# Patient Record
Sex: Female | Born: 1960 | Race: White | Hispanic: No | Marital: Married | Smoking: Never smoker
Health system: Southern US, Community
[De-identification: ages and names within clinical notes are randomized; demographics above are authoritative.]

## PROBLEM LIST (undated history)

## (undated) DIAGNOSIS — N393 Stress incontinence (female) (male): Secondary | ICD-10-CM

## (undated) DIAGNOSIS — R0789 Other chest pain: Secondary | ICD-10-CM

## (undated) DIAGNOSIS — Z780 Asymptomatic menopausal state: Secondary | ICD-10-CM

## (undated) HISTORY — DX: Other chest pain: R07.89

## (undated) HISTORY — DX: Stress incontinence (female) (male): N39.3

## (undated) HISTORY — DX: Asymptomatic menopausal state: Z78.0

---

## 2007-07-10 ENCOUNTER — Encounter: Admission: RE | Admit: 2007-07-10 | Discharge: 2007-07-10 | Payer: Self-pay | Admitting: Obstetrics and Gynecology

## 2007-07-11 ENCOUNTER — Encounter: Admission: RE | Admit: 2007-07-11 | Discharge: 2007-07-11 | Payer: Self-pay | Admitting: Interventional Radiology

## 2007-08-12 ENCOUNTER — Ambulatory Visit (HOSPITAL_COMMUNITY): Admission: RE | Admit: 2007-08-12 | Discharge: 2007-08-12 | Payer: Self-pay | Admitting: Obstetrics and Gynecology

## 2007-08-12 ENCOUNTER — Encounter (INDEPENDENT_AMBULATORY_CARE_PROVIDER_SITE_OTHER): Payer: Self-pay | Admitting: Obstetrics and Gynecology

## 2007-11-27 ENCOUNTER — Ambulatory Visit (HOSPITAL_COMMUNITY): Admission: RE | Admit: 2007-11-27 | Discharge: 2007-11-28 | Payer: Self-pay | Admitting: Interventional Radiology

## 2007-12-17 ENCOUNTER — Encounter: Admission: RE | Admit: 2007-12-17 | Discharge: 2007-12-17 | Payer: Self-pay | Admitting: Interventional Radiology

## 2008-06-03 ENCOUNTER — Encounter: Admission: RE | Admit: 2008-06-03 | Discharge: 2008-06-03 | Payer: Self-pay | Admitting: Interventional Radiology

## 2010-01-23 ENCOUNTER — Encounter: Payer: Self-pay | Admitting: Interventional Radiology

## 2010-01-24 ENCOUNTER — Encounter: Payer: Self-pay | Admitting: Obstetrics and Gynecology

## 2010-05-17 NOTE — Op Note (Signed)
NAME:  Cindy Rivas, Cindy Rivas      ACCOUNT NO.:  [id number]   MEDICAL RECORD NO.:  [id number]          PATIENT TYPE:  AMB   LOCATION:  SDC                           FACILITY:  [facility]   PHYSICIAN:  Juluis Mire, M.D.   DATE OF BIRTH:  [birth year]   DATE OF PROCEDURE:  DATE OF DISCHARGE:                               OPERATIVE REPORT   PREOPERATIVE DIAGNOSES:  1. Endometrial polyp.  2. Uterine fibroids.   POSTOPERATIVE DIAGNOSES:  1. Endometrial polyp.  2. Uterine fibroids.   PROCEDURES:  1. Paracervical block.  2. Cervical dilatation.  3. Hysteroscopic evaluation.  4. Resection of irregular-minced uterine lining along with endometrial      curettings.   SURGEON:  Juluis Mire, MD   ANESTHESIA:  Paracervical block as well as general.   ESTIMATED BLOOD LOSS:  Minimal.   PACKS AND DRAINS:  None.   INTRAOPERATIVE BLOOD PLACED:  None.   COMPLICATIONS:  None.   INDICATIONS:  Dictated in the history and physical.   PROCEDURE:  The patient was taken to OR.  After satisfactory level of  general anesthesia was obtained, the patient was placed in a dorsal  lithotomy position using the Allen stirrups.  The perineum and vagina  were prepped out with Betadine, and the patient was then draped in a  sterile field.  A speculum was placed in the vaginal vault.  Cervix was  grasped with single-tooth tenaculum.  Paracervical blocks administered  using 1% Nesacaine.  Cervix dilated by size 29 Pratt dilator.  Operative  hysteroscope was then introduced.  Intrauterine cavity was distended  using sorbitol.  Visualization revealed no distinct polyp.  She had had  some irregularities on the right lower uterine wall, this was resected.  We did multiple endometrial biopsies and curettings.  We could see both  tubal ostiums, there were no signs of any perforation.  Total deficit  was between 200 and 300 mL.  At this point in time, a single-  tooth tenaculum and speculum removed.  The patient  taken out of the  dorsal lithotomy position, once alert, and extubated, transferred to  recovery room in good condition.  Sponge, instrument, and needle count  was correct by circulating nurse.      Juluis Mire, M.D.  Electronically Signed     JSM/MEDQ  D:  08/12/2007  T:  08/13/2007  Job:  [id number]

## 2010-05-17 NOTE — H&P (Signed)
NAME:  Cindy Rivas, Cindy Rivas NO.:  [id number]   MEDICAL RECORD NO.:  [id number]          PATIENT TYPE:  AMB   LOCATION:  SDC                           FACILITY:  [facility]   PHYSICIAN:  Juluis Mire, M.D.   DATE OF BIRTH:  08-06-1960   DATE OF ADMISSION:  08/12/2007  DATE OF DISCHARGE:                              HISTORY & PHYSICAL   The patient is a 50 year old gravida 2, para 2 female who presents for  annual exam.  She is scheduled today for hysteroscopic resection of  small polyp.  She had been scheduled for an embolization of the fibroid.  During saline infusion ultrasound, besides the large fibroid on the left  side, there was a polypoid mass within endometrial cavity.  Before  embolization, I suggested to resect the polyp to determine a pathology.  Therefore she comes in today for that.   ALLERGIES:  PENICILLIN.   MEDICATIONS:  None.   PAST MEDICAL HISTORY:  Usual childhood diseases.  No significant  sequelae.   SURGERIES:  She has had tonsillectomy.   OBSTETRICAL:  She has had 2 vaginal deliveries.   FAMILY HISTORY:  Noncontributory.   SOCIAL HISTORY:  No tobacco or alcohol use.   REVIEW OF SYSTEMS:  Noncontributory.   PHYSICAL EXAM:  GENERAL:  The patient is afebrile.  VITAL SIGNS:  Stable.  HEENT: The patient is normocephalic.  Pupils equal, round, and reactive  to light and accommodation.  Extraocular movements are intact.  Sclerae  and conjunctivae are clear.  Oropharynx clear.  NECK:  Without thyromegaly.  BREASTS:  Not examined.  LUNGS:  Clear.  CARDIAC:  Regular rate without murmurs or gallops.  ABDOMEN:  Benign.  No mass, organomegaly, or tenderness.  PELVIC:  Uterus is enlarged 12-14 weeks' size with a known fibroid.  Adnexa unremarkable.  EXTREMITIES:  Trace edema.  NEUROLOGIC:  Grossly within normal limits.   IMPRESSION:  Uterine fibroid with associated symptomatology.   PLAN:  1. Embolization.  2. Endometrial polyp.   PLAN:  The  patient to undergo a hysteroscopic evaluation for resection  of polyp.  Risks of surgery have been discussed including the risk of  infection.  The risk of hemorrhage could require transfusion with risk  of AIDS or hepatitis.  Excessive bleeding could lead to hysterectomy.  There is a risk of injury to adjacent organs that could require further  exploratory surgery.  Risk of deep venous thrombosis and pulmonary  embolus.  The patient does understand indications and risks.      Juluis Mire, M.D.  Electronically Signed     JSM/MEDQ  D:  08/12/2007  T:  08/13/2007  Job:  [id number]

## 2010-09-30 LAB — CBC
MCV: 88.1
Platelets: 184
RBC: 4.73
RDW: 14.5

## 2010-09-30 LAB — HCG, SERUM, QUALITATIVE: Preg, Serum: NEGATIVE

## 2010-10-04 LAB — CBC
Hemoglobin: 11.3 — ABNORMAL LOW
MCHC: 32.5
Platelets: 169
RDW: 12.8

## 2010-10-04 LAB — CREATININE, SERUM
Creatinine, Ser: 0.82
GFR calc Af Amer: >60

## 2010-10-04 LAB — HCG, SERUM, QUALITATIVE: Preg, Serum: NEGATIVE

## 2014-01-05 ENCOUNTER — Other Ambulatory Visit: Payer: Self-pay | Admitting: Obstetrics and Gynecology

## 2014-01-06 LAB — CYTOLOGY - PAP

## 2015-07-13 DIAGNOSIS — H9313 Tinnitus, bilateral: Secondary | ICD-10-CM | POA: Insufficient documentation

## 2015-07-13 DIAGNOSIS — H9042 Sensorineural hearing loss, unilateral, left ear, with unrestricted hearing on the contralateral side: Secondary | ICD-10-CM | POA: Insufficient documentation

## 2015-08-04 ENCOUNTER — Other Ambulatory Visit: Payer: Self-pay | Admitting: Gastroenterology

## 2015-08-04 DIAGNOSIS — R7989 Other specified abnormal findings of blood chemistry: Secondary | ICD-10-CM

## 2015-08-04 DIAGNOSIS — R945 Abnormal results of liver function studies: Principal | ICD-10-CM

## 2015-08-16 ENCOUNTER — Encounter (INDEPENDENT_AMBULATORY_CARE_PROVIDER_SITE_OTHER): Payer: Self-pay

## 2015-08-16 ENCOUNTER — Ambulatory Visit
Admission: RE | Admit: 2015-08-16 | Discharge: 2015-08-16 | Disposition: A | Discharge status: Home / Self Care | Payer: BC Managed Care – PPO | Source: Ambulatory Visit | Attending: Gastroenterology | Admitting: Gastroenterology

## 2015-08-16 DIAGNOSIS — R945 Abnormal results of liver function studies: Principal | ICD-10-CM

## 2015-08-16 DIAGNOSIS — R7989 Other specified abnormal findings of blood chemistry: Secondary | ICD-10-CM

## 2016-11-20 IMAGING — US US ABDOMEN COMPLETE
1 series · 14 of 25 positions shown · non-contrast
Comparison: None.

CLINICAL DATA: Elevated liver function tests

EXAM:
ABDOMEN ULTRASOUND COMPLETE

[Series 1: us abdomen complete · 0.20mm/px · 14 of 73 slices shown]
[im 1/73]
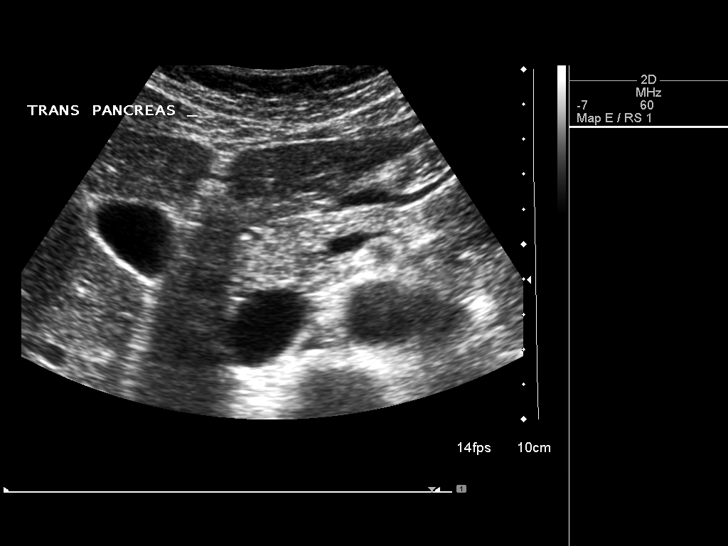
[im 7/73]
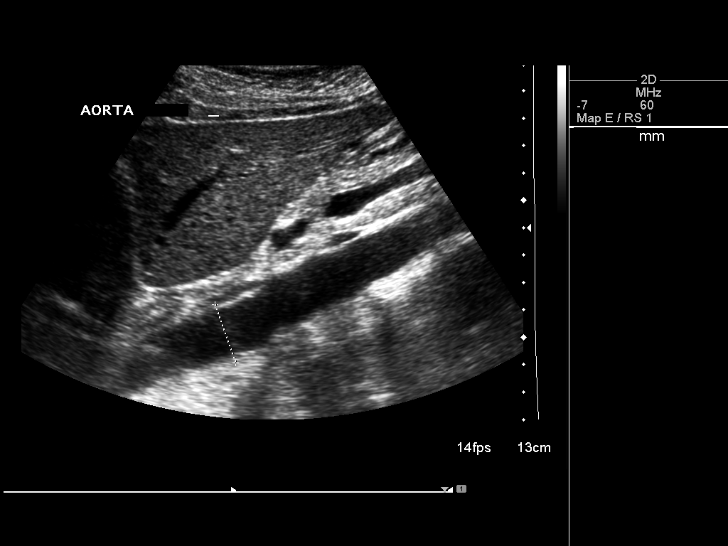
[im 13/73]
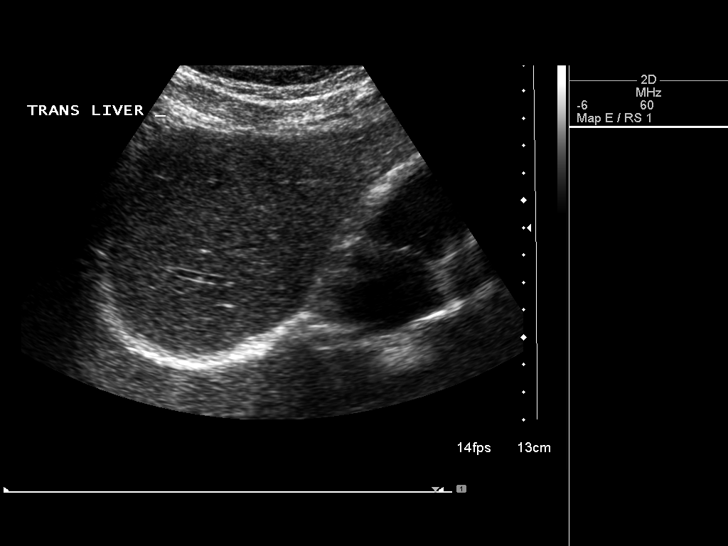
[im 19/73]
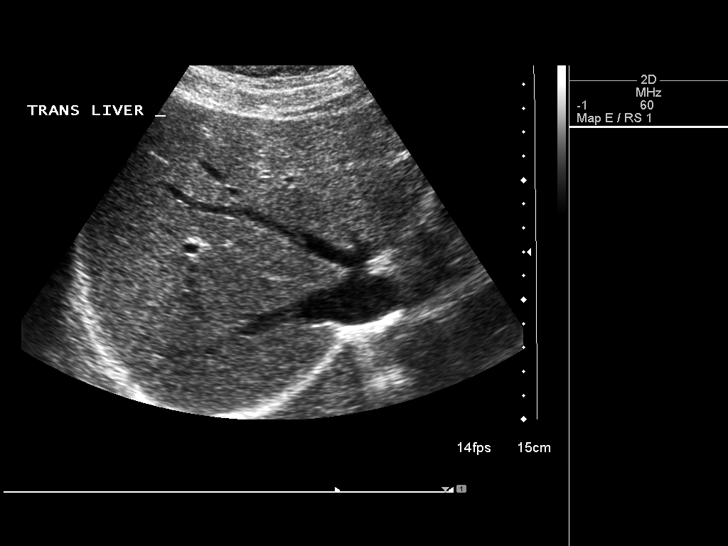
[im 25/73]
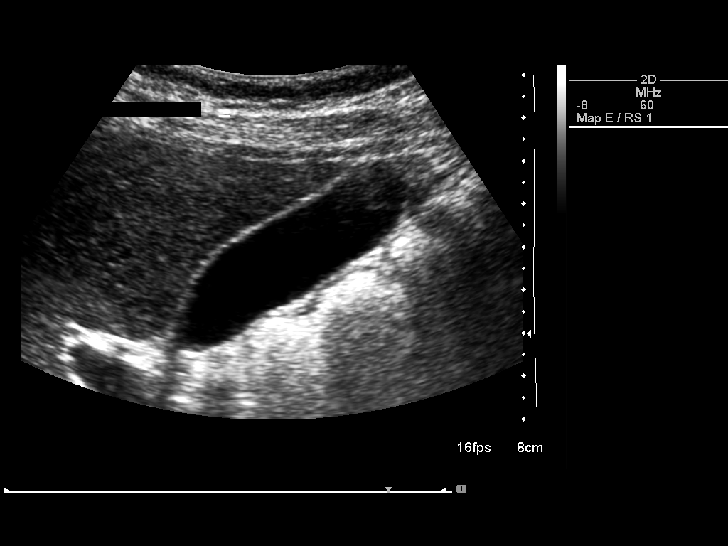
[im 28/73]
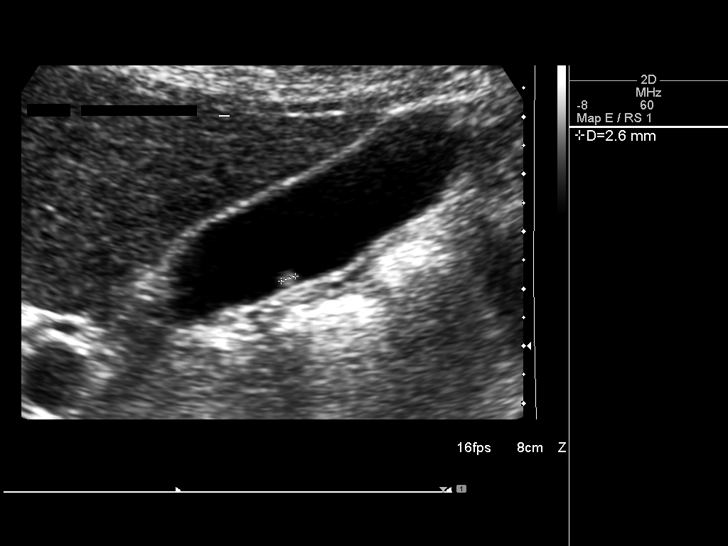
[im 34/73]
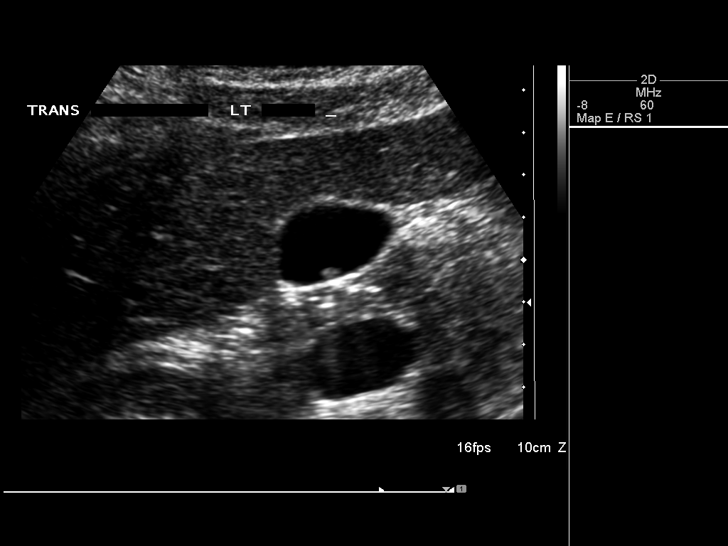
[im 40/73]
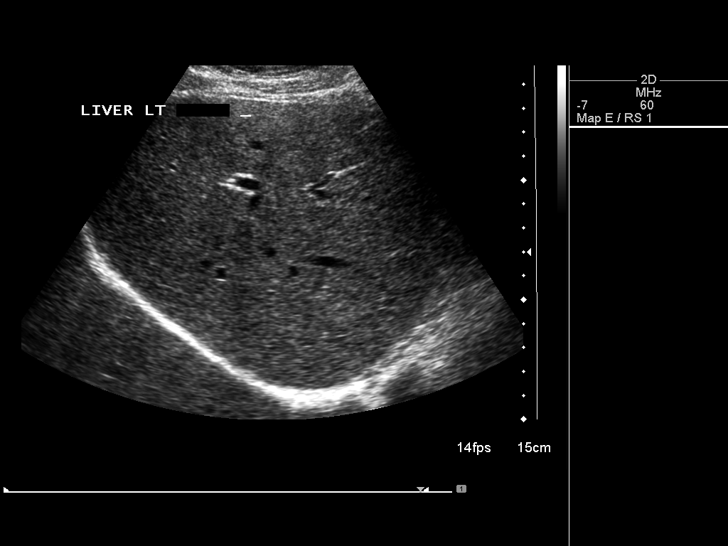
[im 46/73]
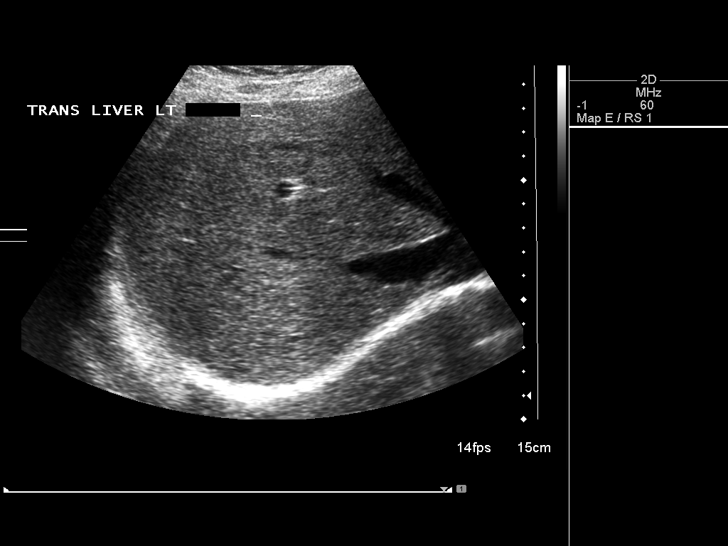
[im 49/73]
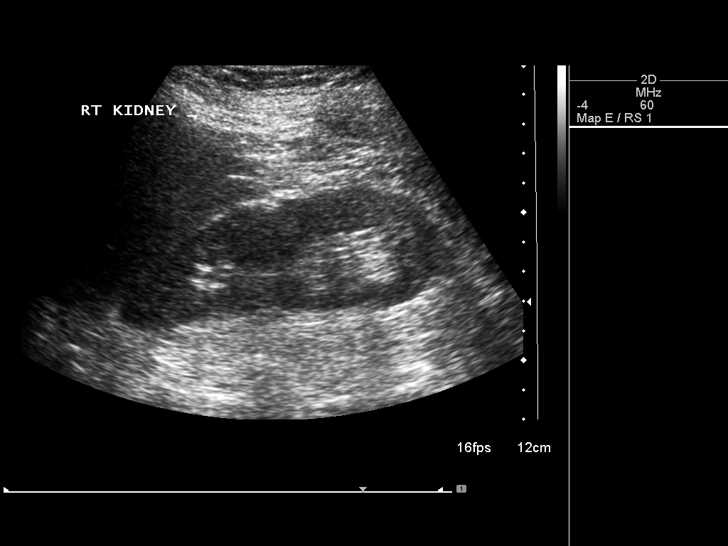
[im 55/73]
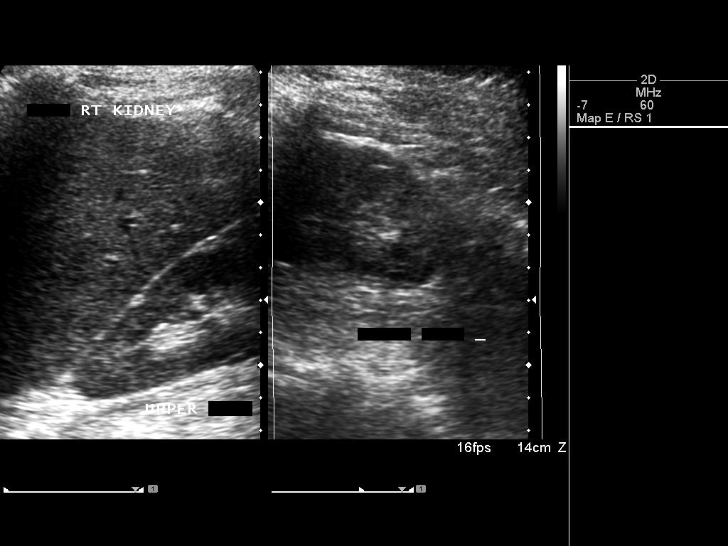
[im 61/73]
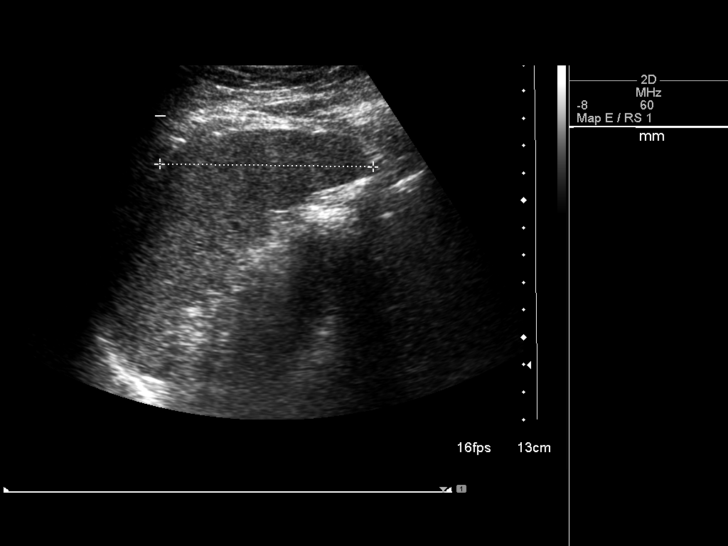
[im 67/73]
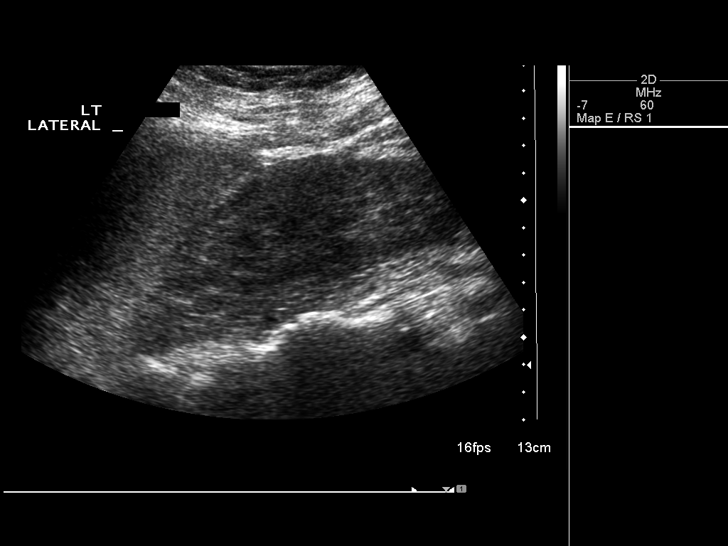
[im 73/73]
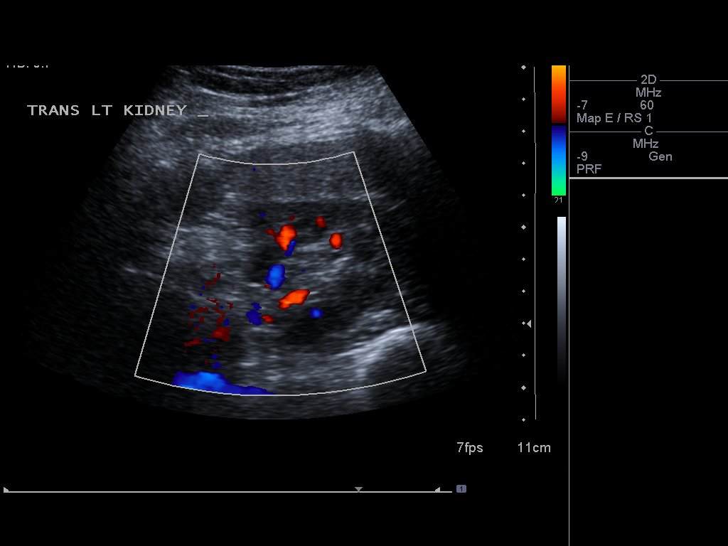

[14 of 25 positions shown; findings below may reference images not displayed]

FINDINGS: Gallbladder: Nonmobile non shadowing 3 mm structure along the
posterior wall of the gallbladder consistent with polyp. No wall
thickening or focal tenderness.

Common bile duct: Diameter: 2 mm

Liver: No focal lesion identified. Within normal limits in
parenchymal echogenicity.

IVC: No abnormality visualized.

Pancreas: Visualized portion unremarkable.

Spleen: Size and appearance within normal limits.

Right Kidney: Length: 11.5 cm. Echogenicity within normal limits. No
mass or hydronephrosis visualized.

Left Kidney: Length: 12.5 cm. Echogenicity within normal limits. No
mass or hydronephrosis visualized.

Abdominal aorta: No aneurysm visualized.

Other findings: None.
IMPRESSION: 1. 3 mm gallbladder polyp.
2.  Otherwise normal exam.  No evidence of hepatic steatosis.

## 2017-11-12 ENCOUNTER — Ambulatory Visit (HOSPITAL_COMMUNITY)
Admission: RE | Admit: 2017-11-12 | Discharge: 2017-11-12 | Disposition: A | Discharge status: Home / Self Care | Payer: BC Managed Care – PPO | Source: Ambulatory Visit | Attending: Obstetrics and Gynecology | Admitting: Obstetrics and Gynecology

## 2017-11-12 ENCOUNTER — Other Ambulatory Visit (HOSPITAL_COMMUNITY): Payer: Self-pay | Admitting: Orthopedic Surgery

## 2017-11-12 DIAGNOSIS — M7989 Other specified soft tissue disorders: Secondary | ICD-10-CM | POA: Insufficient documentation

## 2017-11-12 DIAGNOSIS — M79605 Pain in left leg: Secondary | ICD-10-CM

## 2017-11-12 NOTE — Progress Notes (Signed)
LLE venous duplex prelim: negative for DVT and baker's cyst.  Farrel Demark, RDMS, RVT   Called report to [facility]

## 2019-03-24 ENCOUNTER — Other Ambulatory Visit: Payer: Self-pay

## 2019-03-24 ENCOUNTER — Ambulatory Visit (INDEPENDENT_AMBULATORY_CARE_PROVIDER_SITE_OTHER): Payer: BC Managed Care – PPO

## 2019-03-24 ENCOUNTER — Encounter: Payer: Self-pay | Admitting: Podiatry

## 2019-03-24 ENCOUNTER — Ambulatory Visit: Payer: BC Managed Care – PPO | Admitting: Podiatry

## 2019-03-24 VITALS — Temp 98.0°F

## 2019-03-24 DIAGNOSIS — M2012 Hallux valgus (acquired), left foot: Secondary | ICD-10-CM | POA: Diagnosis not present

## 2019-03-24 DIAGNOSIS — M2011 Hallux valgus (acquired), right foot: Secondary | ICD-10-CM

## 2019-03-24 DIAGNOSIS — M2041 Other hammer toe(s) (acquired), right foot: Secondary | ICD-10-CM | POA: Diagnosis not present

## 2019-03-24 NOTE — Patient Instructions (Signed)
Bunion  A bunion is a bump on the base of the big toe that forms when the bones of the big toe joint move out of position. Bunions may be small at first, but they often get larger over time. They can make walking painful. What are the causes? A bunion may be caused by:  Wearing narrow or pointed shoes that force the big toe to press against the other toes.  Abnormal foot development that causes the foot to roll inward (pronate).  Changes in the foot that are caused by certain diseases, such as rheumatoid arthritis or polio.  A foot injury. What increases the risk? The following factors may make you more likely to develop this condition:  Wearing shoes that squeeze the toes together.  Having certain diseases, such as: ? Rheumatoid arthritis. ? Polio. ? Cerebral palsy.  Having family members who have bunions.  Being born with a foot deformity, such as flat feet or low arches.  Doing activities that put a lot of pressure on the feet, such as ballet dancing. What are the signs or symptoms? The main symptom of a bunion is a noticeable bump on the big toe. Other symptoms may include:  Pain.  Swelling around the big toe.  Redness and inflammation.  Thick or hardened skin on the big toe or between the toes.  Stiffness or loss of motion in the big toe.  Trouble with walking. How is this diagnosed? A bunion may be diagnosed based on your symptoms, medical history, and activities. You may have tests, such as:  X-rays. These allow your health care provider to check the position of the bones in your foot and look for damage to your joint. They also help your health care provider determine the severity of your bunion and the best way to treat it.  Joint aspiration. In this test, a sample of fluid is removed from the toe joint. This test may be done if you are in a lot of pain. It helps rule out diseases that cause painful swelling of the joints, such as arthritis. How is this  treated? Treatment depends on the severity of your symptoms. The goal of treatment is to relieve symptoms and prevent the bunion from getting worse. Your health care provider may recommend:  Wearing shoes that have a wide toe box.  Using bunion pads to cushion the affected area.  Taping your toes together to keep them in a normal position.  Placing a device inside your shoe (orthotics) to help reduce pressure on your toe joint.  Taking medicine to ease pain, inflammation, and swelling.  Applying heat or ice to the affected area.  Doing stretching exercises.  Surgery to remove scar tissue and move the toes back into their normal position. This treatment is rare. Follow these instructions at home: Managing pain, stiffness, and swelling   If directed, put ice on the painful area: ? Put ice in a plastic bag. ? Place a towel between your skin and the bag. ? Leave the ice on for 20 minutes, 2-3 times a day. Activity   If directed, apply heat to the affected area before you exercise. Use the heat source that your health care provider recommends, such as a moist heat pack or a heating pad. ? Place a towel between your skin and the heat source. ? Leave the heat on for 20-30 minutes. ? Remove the heat if your skin turns bright red. This is especially important if you are unable to feel pain,   heat, or cold. You may have a greater risk of getting burned.  Do exercises as told by your health care provider. General instructions  Support your toe joint with proper footwear, shoe padding, or taping as told by your health care provider.  Take over-the-counter and prescription medicines only as told by your health care provider.  Keep all follow-up visits as told by your health care provider. This is important. Contact a health care provider if your symptoms:  Get worse.  Do not improve in 2 weeks. Get help right away if you have:  Severe pain and trouble with walking. Summary  A  bunion is a bump on the base of the big toe that forms when the bones of the big toe joint move out of position.  Bunions can make walking painful.  Treatment depends on the severity of your symptoms.  Support your toe joint with proper footwear, shoe padding, or taping as told by your health care provider. This information is not intended to replace advice given to you by your health care provider. Make sure you discuss any questions you have with your health care provider. Document Revised: 06/25/2017 Document Reviewed: 05/01/2017 Elsevier Patient Education  2020 [employer].  

## 2019-03-25 NOTE — Progress Notes (Signed)
Subjective:   Patient ID: Cindy Rivas, female   DOB: 59 y.o.   MRN: [id number]   HPI Patient presents stating my bunion right is really bothering me and it is getting worse and it throbs at night and it sharp pains when I am walking.  Also my second and fifth toes bother me and make it hard for me to be active and states this is been an ongoing issue   Review of Systems  All other systems reviewed and are negative.       Objective:  Physical Exam Vitals and nursing note reviewed.  Constitutional:      Appearance: She is well-developed.  Pulmonary:     Effort: Pulmonary effort is normal.  Musculoskeletal:        General: Normal range of motion.  Skin:    General: Skin is warm.  Neurological:     Mental Status: She is alert.     Neurovascular status intact muscle strength found to be adequate range of motion within normal limits with large bunion deformity right over left with redness and pain around the side elevated second digit right over left with elongation component and rotated fifth digit right over left that is painful when pressed with keratotic tissue formation.  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  I explained procedures to patient patient wants surgery and will be seen back to recheck in all information given to patient at the current time and patient is willing to accept this risk wants procedure and at this point is scheduled for procedure and will reappoint for consult and was encouraged to bring any questions she has at the time of  procedure.  Understands distal osteotomy would not get her full correction but it should give her good adequate correction and will not require nonweightbearing  X-rays indicate significant structural bunion deformity right over left with digital deformity and elongation digit to bilateral and rotation digit 5

## 2019-05-19 ENCOUNTER — Encounter: Payer: Self-pay | Admitting: Podiatry

## 2019-05-19 ENCOUNTER — Other Ambulatory Visit: Payer: Self-pay

## 2019-05-19 ENCOUNTER — Ambulatory Visit: Payer: BC Managed Care – PPO | Admitting: Podiatry

## 2019-05-19 DIAGNOSIS — M2012 Hallux valgus (acquired), left foot: Secondary | ICD-10-CM | POA: Diagnosis not present

## 2019-05-19 DIAGNOSIS — M2011 Hallux valgus (acquired), right foot: Secondary | ICD-10-CM | POA: Diagnosis not present

## 2019-05-19 DIAGNOSIS — M21621 Bunionette of right foot: Secondary | ICD-10-CM | POA: Diagnosis not present

## 2019-05-19 DIAGNOSIS — M2041 Other hammer toe(s) (acquired), right foot: Secondary | ICD-10-CM | POA: Diagnosis not present

## 2019-05-19 NOTE — Patient Instructions (Signed)
Pre-Operative Instructions  Congratulations, you have decided to take an important step towards improving your quality of life.  You can be assured that the doctors and staff at [facility] will be with you every step of the way.  Here are some important things you should know:  1. Plan to be at the surgery center/hospital at least 1 (one) hour prior to your scheduled time, unless otherwise directed by the surgical center/hospital staff.  You must have a responsible adult accompany you, remain during the surgery and drive you home.  Make sure you have directions to the surgical center/hospital to ensure you arrive on time. 2. If you are having surgery at [facility] or [facility], you will need a copy of your medical history and physical form from your family physician within one month prior to the date of surgery. We will give you a form for your primary physician to complete.  3. We make every effort to accommodate the date you request for surgery.  However, there are times where surgery dates or times have to be moved.  We will contact you as soon as possible if a change in schedule is required.   4. No aspirin/ibuprofen for one week before surgery.  If you are on aspirin, any non-steroidal anti-inflammatory medications (Mobic, Aleve, Ibuprofen) should not be taken seven (7) days prior to your surgery.  You make take Tylenol for pain prior to surgery.  5. Medications - If you are taking daily heart and blood pressure medications, seizure, reflux, allergy, asthma, anxiety, pain or diabetes medications, make sure you notify the surgery center/hospital before the day of surgery so they can tell you which medications you should take or avoid the day of surgery. 6. No food or drink after midnight the night before surgery unless directed otherwise by surgical center/hospital staff. 7. No alcoholic beverages 24-hours prior to surgery.  No smoking 24-hours prior or 24-hours after  surgery. 8. Wear loose pants or shorts. They should be loose enough to fit over bandages, boots, and casts. 9. Don't wear slip-on shoes. Sneakers are preferred. 10. Bring your boot with you to the surgery center/hospital.  Also bring crutches or a walker if your physician has prescribed it for you.  If you do not have this equipment, it will be provided for you after surgery. 11. If you have not been contacted by the surgery center/hospital by the day before your surgery, call to confirm the date and time of your surgery. 12. Leave-time from work may vary depending on the type of surgery you have.  Appropriate arrangements should be made prior to surgery with your employer. 13. Prescriptions will be provided immediately following surgery by your doctor.  Fill these as soon as possible after surgery and take the medication as directed. Pain medications will not be refilled on weekends and must be approved by the doctor. 14. Remove nail polish on the operative foot and avoid getting pedicures prior to surgery. 15. Wash the night before surgery.  The night before surgery wash the foot and leg well with water and the antibacterial soap provided. Be sure to pay special attention to beneath the toenails and in between the toes.  Wash for at least three (3) minutes. Rinse thoroughly with water and dry well with a towel.  Perform this wash unless told not to do so by your physician.  Enclosed: 1 Ice pack (please put in freezer the night before surgery)   1 Hibiclens skin cleaner     Pre-op instructions  If you have any questions regarding the instructions, please do not hesitate to call our office.  [place]: [address], [place], [place] [address] -- [phone]  [place]: [address] [place], [place] [address] -- [phone]  [place]: [address], [place], [place] [address] -- [phone]   Website: [email or web address] 

## 2019-05-21 NOTE — Progress Notes (Signed)
Subjective:   Patient ID: Cindy Rivas, female   DOB: 59 y.o.   MRN: [id number]   HPI Patient presents with significant structural bunion deformity hammertoe deformity and movement of the big toe.  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  At this point I recommended structural bunion correction with distal osteotomy along with possible Akin and digital fusion digit 2 and arthroplasty 5.  I reviewed the condition I discussed at great length surgery and I allowed her to read consent form going over alternative treatments complications.  Patient wants surgery signed consent form is given all preoperative instructions at the current time and is educated on what will be required.  Patient understands total recovery will take 6 months to 1 year and today I did dispense air fracture walker with all instructions on use

## 2019-05-27 ENCOUNTER — Telehealth: Payer: Self-pay

## 2019-05-27 NOTE — Telephone Encounter (Signed)
DOS 06/17/2019  [place] BUNIONECTOMY RT - 30160 POSS. [place] OSTEOTOMY RT - 10932 METATARSAL OSTEOTOMY 5TH RT - 28308 HAMMETOE REPAIR 2ND & 5TH RT - [phone]  ST [employer] EFFECTIVE DATE - 01/03/2019      In-Network   Max Per Benefit Period Year-to-Date Remaining     CoInsurance 20%      Deductible $1250.00 $1250.00     Out-Of-Pocket $4890.00 $4730.00  Copay Not Applicable Coinsurance  20%  per  SERVICE YEAR

## 2019-06-16 MED ORDER — OXYCODONE-ACETAMINOPHEN 10-325 MG PO TABS: 1.0000 | ORAL_TABLET | ORAL | 0 refills | Status: DC | PRN

## 2019-06-16 MED ORDER — ONDANSETRON HCL 4 MG PO TABS: 4.0000 mg | ORAL_TABLET | Freq: Three times a day (TID) | ORAL | 0 refills | Status: DC | PRN

## 2019-06-16 NOTE — Addendum Note (Signed)
Addended by: Lenn Sink on: 06/16/2019 05:08 PM   Modules accepted: Orders

## 2019-06-17 ENCOUNTER — Encounter: Payer: Self-pay | Admitting: Podiatry

## 2019-06-17 DIAGNOSIS — M2041 Other hammer toe(s) (acquired), right foot: Secondary | ICD-10-CM | POA: Diagnosis not present

## 2019-06-17 DIAGNOSIS — M2011 Hallux valgus (acquired), right foot: Secondary | ICD-10-CM | POA: Diagnosis not present

## 2019-06-18 ENCOUNTER — Telehealth: Payer: Self-pay | Admitting: Podiatry

## 2019-06-18 NOTE — Telephone Encounter (Signed)
This patient calls to discuss her postoperative instructions following her surgery by Dr. Charlsie Merles this morning.  This patient was concerned on how to tell if her toes are discolored or purple if she is wearing her bandage and wearing the cam walker I told her she needs to remove her foot and look at the tips of the toes to check on their color.  She was then told that she needs to ambulate as well as sleep in her cam walker.  Finally she was told to apply ice at the anterior aspect of the ankle to help cut down on blood flow to the surgical area.  Patient states that she is not in pain yet following her surgery.   Helane Gunther DPM

## 2019-06-23 ENCOUNTER — Ambulatory Visit (INDEPENDENT_AMBULATORY_CARE_PROVIDER_SITE_OTHER): Payer: BC Managed Care – PPO | Admitting: Podiatrist

## 2019-06-23 ENCOUNTER — Ambulatory Visit (INDEPENDENT_AMBULATORY_CARE_PROVIDER_SITE_OTHER): Payer: BC Managed Care – PPO

## 2019-06-23 ENCOUNTER — Encounter: Payer: Self-pay | Admitting: Podiatrist

## 2019-06-23 ENCOUNTER — Other Ambulatory Visit: Payer: Self-pay

## 2019-06-23 VITALS — BP 102/72 | Temp 96.1°F

## 2019-06-23 DIAGNOSIS — Z9889 Other specified postprocedural states: Secondary | ICD-10-CM

## 2019-06-23 DIAGNOSIS — M21621 Bunionette of right foot: Secondary | ICD-10-CM

## 2019-06-23 DIAGNOSIS — M21611 Bunion of right foot: Secondary | ICD-10-CM

## 2019-06-23 DIAGNOSIS — M2041 Other hammer toe(s) (acquired), right foot: Secondary | ICD-10-CM

## 2019-06-23 DIAGNOSIS — M21619 Bunion of unspecified foot: Secondary | ICD-10-CM

## 2019-06-28 NOTE — Progress Notes (Signed)
Chief Complaint  Patient presents with  . Routine Post Op    pov #1 dos 06/17/19 [place] bunionectomy bi planar, hammertoe repair w/ pin 2nd and 5th, and poss [place]iken osteotomy rt, metatarsal osteotomy 5th rt. 2/10 pain without pain meds. Using knee scooter and boot. Using "gray foam" wedge to elevate. No fever. Pt stated, "I start sweating and get nauseated when I stand".     Subjective: Patient presents today1 week status post foot surgery of the right foot.   Patient denies nausea, vomiting, fevers, chills or night sweats.  Relates she does get hot and nauseated when she stands but otherwise feels well when sitting.  Denies calf pain or tenderness to the operative side.  Objective:  Neurovascular status is intact with palpable pedal pulses DP and PT at 2+ out of 4 right foot.  Negative homans sign noted.  Neurological sensation is intact and unchanged as per prior to surgery. Excellent appearance of the postoperative foot is noted.  Dressing is intact upon presenting to the office and once removed sutures and pins are noted to be in place with incision sites well coapted and healing well.  Minimal swelling and ecchymosis present.   Xrays show a well healing surgical foot.  Good alignment and position noted.  Fixation in place.   Assessment: Status post right foot surgery  Plan:  Re dressed the foot with a dry, sterile, compressive dressing. She is given instructions to keep this dressing in place and do not get the foot wet.  She may remove and re wrap the ace wrap if it becomes too tight or too loose.   She may weight bear in the cam boot.   We did take her blood pressure before leaving after she stands up and it did drop a little indicating orthostatic hypotension which is likely why she is feeling nauseated.  Discussed drinking plenty of fluids and standing for a good while to let her blood pressure adjust before moving. Also discussed it could be pain medication related and to wean off as soon as  she is able.  Also recommended she call her primary care physician if the symptoms after standing worsen.  She will return in 2 weeks for suture removal and call sooner if questions arise.

## 2019-07-10 ENCOUNTER — Encounter: Payer: Self-pay | Admitting: Podiatry

## 2019-07-10 ENCOUNTER — Ambulatory Visit (INDEPENDENT_AMBULATORY_CARE_PROVIDER_SITE_OTHER): Payer: BC Managed Care – PPO | Admitting: Podiatry

## 2019-07-10 ENCOUNTER — Other Ambulatory Visit: Payer: Self-pay

## 2019-07-10 DIAGNOSIS — M21619 Bunion of unspecified foot: Secondary | ICD-10-CM | POA: Diagnosis not present

## 2019-07-10 DIAGNOSIS — Z9889 Other specified postprocedural states: Secondary | ICD-10-CM

## 2019-07-14 NOTE — Progress Notes (Signed)
Subjective:   Patient ID: Cindy Rivas, female   DOB: 59 y.o.   MRN: [id number]   HPI Patient states doing well with mild discomfort but overall pleased so far with how she is doing   ROS      Objective:  Physical Exam  Neurovascular status intact with patient's right foot healing well wound edges well coapted good alignment noted with dressing that was dry and intact and negative Homans' sign noted     Assessment:  Patient doing well post foot surgery right     Plan:  Stitches removed today sterile dressing reapplied continue keeping this position reappoint 2 weeks pin removal or earlier if needed

## 2019-07-24 ENCOUNTER — Ambulatory Visit (INDEPENDENT_AMBULATORY_CARE_PROVIDER_SITE_OTHER): Payer: BC Managed Care – PPO

## 2019-07-24 ENCOUNTER — Ambulatory Visit (INDEPENDENT_AMBULATORY_CARE_PROVIDER_SITE_OTHER): Payer: BC Managed Care – PPO | Admitting: Podiatry

## 2019-07-24 ENCOUNTER — Other Ambulatory Visit: Payer: Self-pay

## 2019-07-24 ENCOUNTER — Encounter: Payer: Self-pay | Admitting: Podiatry

## 2019-07-24 DIAGNOSIS — M2041 Other hammer toe(s) (acquired), right foot: Secondary | ICD-10-CM

## 2019-07-24 DIAGNOSIS — Z9889 Other specified postprocedural states: Secondary | ICD-10-CM

## 2019-07-28 NOTE — Progress Notes (Signed)
Subjective:   Patient ID: Cindy Rivas, female   DOB: 59 y.o.   MRN: [id number]   HPI Patient states overall doing pretty well but I am ready to get this pin out and states that the pain while present is continuing to improve   ROS      Objective:  Physical Exam  Neurovascular status intact muscle strength was found to be adequate range of motion is adequate with negative Denna Haggard' sign noted.  Incision sites are healing well wound edges well coapted pin in place good alignment noted     Assessment:  Overall doing well post foot reconstruction right 8     Plan:  NP reviewed condition pin removed sterile dressing applied advised on physical therapy and range of motion exercises and we will work it ways to improve this and discussed gradual return to soft shoe gear over the next week or 2  X-rays indicate the osteotomy is healing well fixation in place good reduction of deformity

## 2019-08-21 ENCOUNTER — Encounter: Payer: Self-pay | Admitting: Podiatry

## 2019-08-21 ENCOUNTER — Other Ambulatory Visit: Payer: Self-pay

## 2019-08-21 ENCOUNTER — Ambulatory Visit (INDEPENDENT_AMBULATORY_CARE_PROVIDER_SITE_OTHER): Payer: BC Managed Care – PPO | Admitting: Podiatry

## 2019-08-21 ENCOUNTER — Ambulatory Visit (INDEPENDENT_AMBULATORY_CARE_PROVIDER_SITE_OTHER): Payer: BC Managed Care – PPO

## 2019-08-21 VITALS — Temp 97.0°F

## 2019-08-21 DIAGNOSIS — R7989 Other specified abnormal findings of blood chemistry: Secondary | ICD-10-CM | POA: Insufficient documentation

## 2019-08-21 DIAGNOSIS — J309 Allergic rhinitis, unspecified: Secondary | ICD-10-CM | POA: Insufficient documentation

## 2019-08-21 DIAGNOSIS — M2041 Other hammer toe(s) (acquired), right foot: Secondary | ICD-10-CM

## 2019-08-21 DIAGNOSIS — I7300 Raynaud's syndrome without gangrene: Secondary | ICD-10-CM | POA: Insufficient documentation

## 2019-08-21 MED ORDER — DICLOFENAC SODIUM 75 MG PO TBEC: 75.0000 mg | DELAYED_RELEASE_TABLET | Freq: Two times a day (BID) | ORAL | 2 refills | Status: DC

## 2019-09-11 NOTE — Progress Notes (Signed)
Subjective:   Patient ID: Cindy Rivas, female   DOB: 59 y.o.   MRN: [id number]   HPI Patient states overall doing pretty well but I still get some pain in the bottom of my foot and overall I feel like I am improving but I am not 100%   ROS      Objective:  Physical Exam  Neurovascular status intact negative Denna Haggard' sign noted patient's right foot healing well wound edges well coapted with moderate edema probably creating some tightness of the joint surfaces with good range of motion first MPJ     Assessment:  Overall doing well but does still have some swelling of the foot     Plan:  H&P reviewed condition recommended the continuation of anti-inflammatory support shoe and stretching exercises.  Patient will be seen back to recheck in 6 weeks and is overall making good recovery  X-rays indicate that the osteotomies are healing well alignment overall looks good fixation looks good

## 2019-10-06 ENCOUNTER — Ambulatory Visit: Payer: BC Managed Care – PPO | Admitting: Podiatry

## 2019-10-13 ENCOUNTER — Encounter: Payer: Self-pay | Admitting: Podiatry

## 2019-10-13 ENCOUNTER — Other Ambulatory Visit: Payer: Self-pay

## 2019-10-13 ENCOUNTER — Ambulatory Visit (INDEPENDENT_AMBULATORY_CARE_PROVIDER_SITE_OTHER): Payer: BC Managed Care – PPO | Admitting: Podiatry

## 2019-10-13 ENCOUNTER — Ambulatory Visit (INDEPENDENT_AMBULATORY_CARE_PROVIDER_SITE_OTHER): Payer: BC Managed Care – PPO

## 2019-10-13 DIAGNOSIS — M2041 Other hammer toe(s) (acquired), right foot: Secondary | ICD-10-CM | POA: Diagnosis not present

## 2019-10-13 DIAGNOSIS — R600 Localized edema: Secondary | ICD-10-CM

## 2019-10-15 NOTE — Progress Notes (Signed)
Subjective:   Patient ID: Cindy Rivas, female   DOB: 59 y.o.   MRN: [id number]   HPI Patient presents stating overall doing well but I am still getting some swelling within my foot and underneath my big toe joint right.  I am wearing shoes but it is bothersome for me and I do have bad veins   ROS      Objective:  Physical Exam  Vascular status intact negative Denna Haggard' sign was noted with patient found to have persistent forefoot midfoot and into the ankle edema right with good range of motion first MPJ incision sites that have healed well no crepitus with moderate discomfort around the joint with swelling     Assessment:  Swelling that appears to be more due to probable vein disease and the fact she has to be on her foot all the time     Plan:  H&P reviewed condition and I applied Unna boot Ace wrap and advised on elevation to try to help the condition.  Patient will wear the Unna boot 3 days but is instructed to take it off earlier if any pathology were to occur and we did review that structurally she looks excellent  X-rays indicate the osteotomies are healing well no signs of pathology

## 2019-10-29 ENCOUNTER — Other Ambulatory Visit: Payer: Self-pay | Admitting: Obstetrics and Gynecology

## 2019-10-29 DIAGNOSIS — Z803 Family history of malignant neoplasm of breast: Secondary | ICD-10-CM

## 2019-10-30 ENCOUNTER — Telehealth: Payer: Self-pay

## 2019-10-30 NOTE — Telephone Encounter (Signed)
Notes on file from [facility], [place] [phone]. Sent referral to scheduling.

## 2019-11-13 ENCOUNTER — Encounter: Payer: Self-pay | Admitting: General Practice

## 2019-11-24 ENCOUNTER — Encounter: Payer: Self-pay | Admitting: Podiatry

## 2019-11-24 ENCOUNTER — Ambulatory Visit: Payer: BC Managed Care – PPO | Admitting: Podiatry

## 2019-11-24 ENCOUNTER — Other Ambulatory Visit: Payer: Self-pay

## 2019-11-24 DIAGNOSIS — Z9889 Other specified postprocedural states: Secondary | ICD-10-CM

## 2019-11-24 DIAGNOSIS — L600 Ingrowing nail: Secondary | ICD-10-CM | POA: Diagnosis not present

## 2019-11-24 DIAGNOSIS — R600 Localized edema: Secondary | ICD-10-CM

## 2019-12-01 NOTE — Progress Notes (Signed)
Subjective:   Patient ID: Cindy Rivas, female   DOB: 59 y.o.   MRN: [id number]   HPI patient states she is doing well surgically with still some swelling that is been persistent and also is concerned about ingrown toenail right second toe and what we could do neuro   ROS      Objective:  Physical Exam  vascular status intact with patient found to have moderate edema left foot secondary to foot surgery that continues to reduce and is improved at the current time with incurvated second nail right medial border that is painful when pressed and make shoe gear difficult with slight redness     Assessment:  Doing well postoperatively left with continued reduction of swelling commensurate with the surgery with ingrown toenail right     Plan:  H&P done for the left foot I recommended the continuation of anti-inflammatories compression elevation and we discussed correction of the second toe right she wants to get this fixed and will have this done in the next 2 weeks.  Encouraged to call with questions concerns and I did educate her on ingrown toenail removal

## 2019-12-04 ENCOUNTER — Ambulatory Visit: Payer: BC Managed Care – PPO | Admitting: Internal Medicine

## 2019-12-04 ENCOUNTER — Encounter: Payer: Self-pay | Admitting: Internal Medicine

## 2019-12-04 ENCOUNTER — Other Ambulatory Visit: Payer: Self-pay

## 2019-12-04 ENCOUNTER — Encounter: Payer: Self-pay | Admitting: *Deleted

## 2019-12-04 VITALS — BP 120/82 | HR 75 | Ht 63.0 in | Wt 182.0 lb

## 2019-12-04 DIAGNOSIS — R079 Chest pain, unspecified: Secondary | ICD-10-CM | POA: Diagnosis not present

## 2019-12-04 DIAGNOSIS — R011 Cardiac murmur, unspecified: Secondary | ICD-10-CM

## 2019-12-04 DIAGNOSIS — R0789 Other chest pain: Secondary | ICD-10-CM | POA: Insufficient documentation

## 2019-12-04 NOTE — Patient Instructions (Addendum)
Medication Instructions:   Your physician recommends that you continue on your current medications as directed. Please refer to the Current Medication list given to you today.  *If you need a refill on your cardiac medications before your next appointment, please call your pharmacy*   Lab Work: NONE ORDERED  TODAY    If you have labs (blood work) drawn today and your tests are completely normal, you will receive your results only by: Marland Kitchen MyChart Message (if you have MyChart) OR . A paper copy in the mail If you have any lab test that is abnormal or we need to change your treatment, we will call you to review the results.   Testing/Procedures:  Your physician has requested that you have an echocardiogram. Echocardiography is a painless test that uses sound waves to create images of your heart. It provides your doctor with information about the size and shape of your heart and how well your heart's chambers and valves are working. This procedure takes approximately one hour. There are no restrictions for this procedure.  NEED COVID TESTING SCHEDULED    Your physician has requested that you have an exercise tolerance test. For further information please visit [email or web address] Please also follow instruction sheet, as given.    Follow-Up: At [facility], you and your health needs are our priority.  As part of our continuing mission to provide you with exceptional heart care, we have created designated Provider Care Teams.  These Care Teams include your primary Cardiologist (physician) and Advanced Practice Providers (APPs -  Physician Assistants and Nurse Practitioners) who all work together to provide you with the care you need, when you need it.  We recommend signing up for the patient portal called "MyChart".  Sign up information is provided on this After Visit Summary.  MyChart is used to connect with patients for Virtual Visits (Telemedicine).  Patients are able to view lab/test  results, encounter notes, upcoming appointments, etc.  Non-urgent messages can be sent to your provider as well.   To learn more about what you can do with MyChart, go to [email or web address].    Your next appointment:   2-3  month(s)  The format for your next appointment:   In Person  Provider:   You may see  Dr Izora Ribas or one of the following Advanced Practice Providers on your designated Care Team:    Ronie Spies, PA-C  Jacolyn Reedy, PA-C    Other Instructions

## 2019-12-04 NOTE — Progress Notes (Signed)
Cardiology Office Note:    Date:  12/04/2019   ID:  Cindy Rivas, DOB 1960-05-12, MRN [id number]  PCP:  Deloris Ping, MD  [facility] Cardiologist:  Christell Constant, MD  [facility] Electrophysiologist:  None   CC: chest pain Consulted for the evaluation of chest pain at the behest of Ryter-Brown, [name], MD  History of Present Illness:    Cindy Rivas is a 59 y.o. female with recent hx of breast mass (pending MRI) who presents with chest pain.  Patient notes that 4 years ago had a bee-sting followed by angina;went to the ED and was told she was fine. No follow up CP since until recently.  Was told she had a heart murmur and was told to get this followed up.  Patient notes that she is feeling chest pain only when drivign. No chest pressure, chest tightness, chest stinging.  Discomfort occurs at rest; sternal and does not radiate .  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  No shortness of breath, DOE .  No PND or orthopnea.  No bendopnea, weight gain, leg swelling , or abdominal swelling.  No syncope or near syncope .  Notes no no palpitations or funny heart beats.     Patient reports prior cardiac testing including no echo, no stress test, no heart catheterizations, no cardioversion, no ablations.   Past Medical History:  Diagnosis Date  . Chest discomfort   . Post-menopausal   . SUI (stress urinary incontinence, female)     No past surgical history on file.  Current Medications: Current Meds  Medication Sig  . Cholecalciferol 1.25 MG (50000 UT) capsule once a week.  . diclofenac (VOLTAREN) 75 MG EC tablet Take 1 tablet (75 mg total) by mouth 2 (two) times daily. (Patient taking differently: Take 75 mg by mouth daily in the afternoon. )     Allergies:   Bee venom and Penicillins   Social History   Socioeconomic History  . Marital status: Married    Spouse name: Not on file  .  Number of children: Not on file  . Years of education: Not on file  . Highest education level: Not on file  Occupational History  . Not on file  Tobacco Use  . Smoking status: Never Smoker  . Smokeless tobacco: Never Used  Substance and Sexual Activity  . Alcohol use: Not on file  . Drug use: Not on file  . Sexual activity: Not on file  Other Topics Concern  . Not on file  Social History Narrative  . Not on file   Social Determinants of Health   Financial Resource Strain:   . Difficulty of Paying Living Expenses: Not on file  Food Insecurity:   . Worried About Programme researcher, broadcasting/film/video in the Last Year: Not on file  . Ran Out of Food in the Last Year: Not on file  Transportation Needs:   . Lack of Transportation (Medical): Not on file  . Lack of Transportation (Non-Medical): Not on file  Physical Activity:   . Days of Exercise per Week: Not on file  . Minutes of Exercise per Session: Not on file  Stress:   . Feeling of Stress : Not on file  Social Connections:   . Frequency of Communication with Friends and Family: Not on file  . Frequency of Social Gatherings with Friends and Family: Not on file  . Attends Religious Services: Not on file  .  Active Member of Clubs or Organizations: Not on file  . Attends Banker Meetings: Not on file  . Marital Status: Not on file    1st Grade Teacher at [employer]  Family History: The patient'family history includes Heart disease in her mother; Other in her mother. Endorses family history of sudden cardiac death in grandfather at the age of 91 History of coronary artery disease notable for father. History of heart failure in mother*.  ROS:   Please see the history of present illness.     All other systems reviewed and are negative.  EKGs/Labs/Other Studies Reviewed:    The following studies were reviewed today:  EKG:  EKG is  ordered today.  The ekg ordered today demonstrates Sr rate 75 anterior TWI  Recent  Labs: No results found for requested labs within last 8760 hours.  Recent Lipid Panel No results found for: CHOL, TRIG, HDL, CHOLHDL, VLDL, LDLCALC, LDLDIRECT  OSH Labs  Hgb 13.6  Risk Assessment/Calculations:     N/A  Physical Exam:    VS:  BP 120/82   Pulse 75   Ht 5\' 3"  (1.6 m)   Wt 182 lb (82.6 kg)   SpO2 98%   BMI 32.24 kg/m     Wt Readings from Last 3 Encounters:  12/04/19 182 lb (82.6 kg)     GEN:  Well nourished, well developed in no acute distress HEENT: Normal NECK: No JVD; No carotid bruits LYMPHATICS: No lymphadenopathy CARDIAC: RRR, sysolic crescendo II/VI without radiation, rubs, gallops RESPIRATORY:  Clear to auscultation without rales, wheezing or rhonchi  ABDOMEN: Soft, non-tender, non-distended MUSCULOSKELETAL:  No edema; No deformity  SKIN: Warm and dry NEUROLOGIC:  Alert and oriented x 3 PSYCHIATRIC:  Normal affect   ASSESSMENT:    1. Heart murmur   2. Chest pain of uncertain etiology    PLAN:    In order of problems listed above:  Chest Pain - The patient presents with atypical chest pain but with CP admission in the past with unclear outcome - EKG shows  without evidence of accessory pathway, ventricular pacing, digoxin use, LBBB, or baseline ST changes. - Would recommend exercise stress test (NPO at midnight); discussed risks, benefits, and alternatives of the diagnostic procedure including chest pain, arrhythmia, and death.  Patient amenable for testing.  Systolic Heart Murmur SCD in Grandfather - will get echocardiogram; hoping to rule out Bicuspid Valve and thoracic aortic aneurysm   Shared Decision Making/Informed Consent    The risks [chest pain, shortness of breath, cardiac arrhythmias, dizziness, blood pressure fluctuations, myocardial infarction, stroke/transient ischemic attack, and life-threatening complications (estimated to be 1 in 10,000)], benefits (risk stratification, diagnosing coronary artery disease, treatment  guidance) and alternatives of an exercise tolerance test were discussed in detail with [name] and she agrees to proceed.      Medication Adjustments/Labs and Tests Ordered: Current medicines are reviewed at length with the patient today.  Concerns regarding medicines are outlined above.  Orders Placed This Encounter  Procedures  . Exercise Tolerance Test  . EKG 12-Lead  . ECHOCARDIOGRAM COMPLETE   No orders of the defined types were placed in this encounter.   Patient Instructions  Medication Instructions:   Your physician recommends that you continue on your current medications as directed. Please refer to the Current Medication list given to you today.  *If you need a refill on your cardiac medications before your next appointment, please call your pharmacy*   Lab Work: NONE ORDERED  TODAY    If you have labs (blood work) drawn today and your tests are completely normal, you will receive your results only by: Marland Kitchen MyChart Message (if you have MyChart) OR . A paper copy in the mail If you have any lab test that is abnormal or we need to change your treatment, we will call you to review the results.   Testing/Procedures:  Your physician has requested that you have an echocardiogram. Echocardiography is a painless test that uses sound waves to create images of your heart. It provides your doctor with information about the size and shape of your heart and how well your heart's chambers and valves are working. This procedure takes approximately one hour. There are no restrictions for this procedure.  NEED COVID TESTING SCHEDULED    Your physician has requested that you have an exercise tolerance test. For further information please visit [email or web address] Please also follow instruction sheet, as given.    Follow-Up: At [facility], you and your health needs are our priority.  As part of our continuing mission to provide you with exceptional heart care, we have  created designated Provider Care Teams.  These Care Teams include your primary Cardiologist (physician) and Advanced Practice Providers (APPs -  Physician Assistants and Nurse Practitioners) who all work together to provide you with the care you need, when you need it.  We recommend signing up for the patient portal called "MyChart".  Sign up information is provided on this After Visit Summary.  MyChart is used to connect with patients for Virtual Visits (Telemedicine).  Patients are able to view lab/test results, encounter notes, upcoming appointments, etc.  Non-urgent messages can be sent to your provider as well.   To learn more about what you can do with MyChart, go to [email or web address].    Your next appointment:   2-3  month(s)  The format for your next appointment:   In Person  Provider:   You may see  Dr Izora Ribas or one of the following Advanced Practice Providers on your designated Care Team:    Ronie Spies, PA-C  Jacolyn Reedy, PA-C    Other Instructions      Signed, Christell Constant, MD  12/04/2019 5:10 PM    [facility]

## 2019-12-17 ENCOUNTER — Other Ambulatory Visit: Payer: Self-pay | Admitting: Podiatry

## 2019-12-21 NOTE — Telephone Encounter (Signed)
Please advise 

## 2019-12-22 NOTE — Telephone Encounter (Signed)
Not sure if she needs to continue taking at this time. She should not need

## 2019-12-29 ENCOUNTER — Other Ambulatory Visit: Payer: Self-pay

## 2019-12-29 ENCOUNTER — Ambulatory Visit (HOSPITAL_COMMUNITY): Payer: BC Managed Care – PPO | Attending: Cardiology

## 2019-12-29 DIAGNOSIS — R011 Cardiac murmur, unspecified: Secondary | ICD-10-CM | POA: Diagnosis not present

## 2019-12-29 LAB — ECHOCARDIOGRAM COMPLETE
Area-P 1/2: 3.21 cm2
S' Lateral: 2.7 cm

## 2020-03-11 ENCOUNTER — Other Ambulatory Visit: Payer: Self-pay | Admitting: *Deleted

## 2020-03-11 DIAGNOSIS — R079 Chest pain, unspecified: Secondary | ICD-10-CM

## 2020-04-20 ENCOUNTER — Other Ambulatory Visit (HOSPITAL_COMMUNITY)
Admission: RE | Admit: 2020-04-20 | Discharge: 2020-04-20 | Disposition: A | Discharge status: Home / Self Care | Payer: BC Managed Care – PPO | Source: Ambulatory Visit | Attending: Internal Medicine | Admitting: Internal Medicine

## 2020-04-20 DIAGNOSIS — Z20822 Contact with and (suspected) exposure to covid-19: Secondary | ICD-10-CM | POA: Insufficient documentation

## 2020-04-20 DIAGNOSIS — Z01812 Encounter for preprocedural laboratory examination: Secondary | ICD-10-CM | POA: Insufficient documentation

## 2020-04-20 LAB — SARS CORONAVIRUS 2 (TAT 6-24 HRS): SARS Coronavirus 2: NEGATIVE

## 2020-04-21 ENCOUNTER — Telehealth (HOSPITAL_COMMUNITY): Payer: Self-pay | Admitting: *Deleted

## 2020-04-21 NOTE — Telephone Encounter (Signed)
Close encounter 

## 2020-04-23 ENCOUNTER — Other Ambulatory Visit: Payer: Self-pay

## 2020-04-23 ENCOUNTER — Ambulatory Visit (HOSPITAL_COMMUNITY)
Admission: RE | Admit: 2020-04-23 | Discharge: 2020-04-23 | Disposition: A | Discharge status: Home / Self Care | Payer: BC Managed Care – PPO | Source: Ambulatory Visit | Attending: Cardiovascular Disease | Admitting: Cardiovascular Disease

## 2020-04-23 DIAGNOSIS — R011 Cardiac murmur, unspecified: Secondary | ICD-10-CM | POA: Diagnosis not present

## 2020-04-23 DIAGNOSIS — R079 Chest pain, unspecified: Secondary | ICD-10-CM

## 2020-04-23 LAB — EXERCISE TOLERANCE TEST
Estimated workload: 10.4 METS
Exercise duration (min): 8 min
Exercise duration (sec): 32 s
MPHR: 161 {beats}/min
Peak HR: 151 {beats}/min
Percent HR: 93 %
Rest HR: 63 {beats}/min

## 2020-05-03 ENCOUNTER — Other Ambulatory Visit: Payer: Self-pay

## 2020-05-03 ENCOUNTER — Ambulatory Visit: Payer: BC Managed Care – PPO | Admitting: Internal Medicine

## 2020-05-03 ENCOUNTER — Encounter: Payer: Self-pay | Admitting: Internal Medicine

## 2020-05-03 VITALS — BP 118/68 | HR 70 | Ht 63.0 in | Wt 186.0 lb

## 2020-05-03 DIAGNOSIS — I341 Nonrheumatic mitral (valve) prolapse: Secondary | ICD-10-CM

## 2020-05-03 DIAGNOSIS — R0789 Other chest pain: Secondary | ICD-10-CM

## 2020-05-03 NOTE — Progress Notes (Signed)
Cardiology Office Note:    Date:  05/03/2020   ID:  [name], DOB 1960/12/08, MRN [id number]  PCP:  Deloris Ping, MD  [facility] Cardiologist:  Christell Constant, MD  [facility] Electrophysiologist:  None   CC: Chest pain f/u  History of Present Illness:    Cindy Rivas is a 60 y.o. female with prior hx of breast mass, MVP who presented with chest pain 12/04/19. In interim of this visit, patient had negative stress test and echo.  Patient notes that she is doing well.  Since last visit notes no changes.  There are no  interval hospital/ED visit.    Notes rare chest pain with driving.  No SOB/DOE and no PND/Orthopnea.  No weight gain or leg swelling.  No palpitations or syncope .   Past Medical History:  Diagnosis Date  . Chest discomfort   . Post-menopausal   . SUI (stress urinary incontinence, female)     No past surgical history on file.  Current Medications: Current Meds  Medication Sig  . Cholecalciferol 1.25 MG (50000 UT) capsule once a week.  . diclofenac (VOLTAREN) 75 MG EC tablet Take 1 tablet (75 mg total) by mouth 2 (two) times daily. (Patient taking differently: Take 75 mg by mouth daily in the afternoon.)     Allergies:   Bee venom, Bee pollen, and Penicillins   Social History   Socioeconomic History  . Marital status: Married    Spouse name: Not on file  . Number of children: Not on file  . Years of education: Not on file  . Highest education level: Not on file  Occupational History  . Not on file  Tobacco Use  . Smoking status: Never Smoker  . Smokeless tobacco: Never Used  Substance and Sexual Activity  . Alcohol use: Not on file  . Drug use: Not on file  . Sexual activity: Not on file  Other Topics Concern  . Not on file  Social History Narrative  . 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 Endorses family history of sudden cardiac death in grandfather at the age of 106 History of coronary artery disease notable for father. History of heart failure in mother.  ROS:   Please see the history of present illness.     All other systems reviewed and are negative.  EKGs/Labs/Other Studies Reviewed:    The following studies were reviewed today:  EKG:  12/04/19: SR rate 75 anterior TWI  Transthoracic Echocardiogram: Date: 12/29/19 Results: 1. Left ventricular ejection fraction, by estimation, is 60 to 65%. The  left ventricle has normal function. The left ventricle has no regional  wall motion abnormalities. Left ventricular diastolic parameters were  normal.  2. Right ventricular systolic function is normal. The right ventricular  size is mildly enlarged. There is normal pulmonary artery systolic  pressure. The estimated right ventricular systolic pressure is 31.8 mmHg.  3. The mitral valve is normal in structure. No evidence of mitral valve  regurgitation. No evidence of mitral stenosis.  4. The aortic valve is tricuspid. Aortic valve regurgitation is not  visualized. No aortic stenosis is present.  5. The inferior vena cava is dilated in size with >50% respiratory  variability, suggesting right atrial pressure of 8 mmHg.   ECG Stress Testing : Date:04/23/20 Results:  There was no ST segment deviation noted during stress.  The patient walked for 8 minutes and 32 seconds of a standard Bruce protocol treadmill test.  She achieved a peak heart rate of 151 which is 93% predicted maximal heart rate.  At peak exercise there were no ST or T wave changes to suggest ischemia. There was no QRS widening at peak  exercise.  Her blood pressure response to exercise was normal.  This is interpreted as a negative exercise test. There is no evidence of ischemia.    Recent Labs: No results found for requested labs within last 8760 hours.  Recent Lipid Panel No results found for: CHOL, TRIG, HDL, CHOLHDL, VLDL, LDLCALC, LDLDIRECT   Risk Assessment/Calculations:     N/A  Physical Exam:    VS:  BP 118/68   Pulse 70   Ht 5\' 3"  (1.6 m)   Wt 186 lb (84.4 kg)   SpO2 97%   BMI 32.95 kg/m     Wt Readings from Last 3 Encounters:  05/03/20 186 lb (84.4 kg)  12/04/19 182 lb (82.6 kg)     GEN:  Well nourished, well developed in no acute distress HEENT: Normal NECK: No JVD; No carotid bruits LYMPHATICS: No lymphadenopathy CARDIAC: RRR, sysolic crescendo I/VI without radiation, rubs, gallops RESPIRATORY:  Clear to auscultation without rales, wheezing or rhonchi  ABDOMEN: Soft, non-tender, non-distended MUSCULOSKELETAL:  No edema; No deformity  SKIN: Warm and dry NEUROLOGIC:  Alert and oriented x 3 PSYCHIATRIC:  Normal affect   ASSESSMENT:    1. MVP (mitral valve prolapse)   2. Non-cardiac chest pain    PLAN:    In order of problems listed above:  MVP Non Cardiac Chest pain - discussed MVP spectrum and red flags for re-evaluation (SOB, palpitations)  PRN follow up unless new symptoms or abnormal test results warranting change in plan  Would be reasonable for  APP Follow up      Medication Adjustments/Labs and Tests Ordered: Current medicines are reviewed at length with the patient today.  Concerns regarding medicines are outlined above.  No orders of the defined types were placed in this encounter.  No orders of the defined types were placed in this encounter.   Patient Instructions  Medication Instructions:  Your physician recommends that you continue on your current medications as directed. Please refer to the Current Medication list given to you today.  *If you need a  refill on your cardiac medications before your next appointment, please call your pharmacy*   Lab Work: NONE If you have labs (blood work) drawn today and your tests are completely normal, you will receive your results only by: 14/02/21 MyChart Message (if you have MyChart) OR . A paper copy in the mail If you have any lab test that is abnormal or we need to change your treatment, we will call you to review the results.   Testing/Procedures: NONE   Follow-Up: AS NEEDED At [facility], you and your health needs are our priority.  As part of our continuing mission to provide you with exceptional heart care, we have created designated Provider Care Teams.  These Care Teams include your primary Cardiologist (physician) and Advanced Practice Providers (APPs -  Physician Assistants and Nurse Practitioners) who all work together to  provide you with the care you need, when you need it.  We recommend signing up for the patient portal called "MyChart".  Sign up information is provided on this After Visit Summary.  MyChart is used to connect with patients for Virtual Visits (Telemedicine).  Patients are able to view lab/test results, encounter notes, upcoming appointments, etc.  Non-urgent messages can be sent to your provider as well.   To learn more about what you can do with MyChart, go to [email or web address].         Signed, Christell Constant, MD  05/03/2020 5:05 PM    [facility]

## 2020-05-03 NOTE — Patient Instructions (Signed)
Medication Instructions:  Your physician recommends that you continue on your current medications as directed. Please refer to the Current Medication list given to you today.  *If you need a refill on your cardiac medications before your next appointment, please call your pharmacy*   Lab Work: NONE If you have labs (blood work) drawn today and your tests are completely normal, you will receive your results only by: . MyChart Message (if you have MyChart) OR . A paper copy in the mail If you have any lab test that is abnormal or we need to change your treatment, we will call you to review the results.   Testing/Procedures: NONE   Follow-Up: AS NEEDED At [facility], you and your health needs are our priority.  As part of our continuing mission to provide you with exceptional heart care, we have created designated Provider Care Teams.  These Care Teams include your primary Cardiologist (physician) and Advanced Practice Providers (APPs -  Physician Assistants and Nurse Practitioners) who all work together to provide you with the care you need, when you need it.  We recommend signing up for the patient portal called "MyChart".  Sign up information is provided on this After Visit Summary.  MyChart is used to connect with patients for Virtual Visits (Telemedicine).  Patients are able to view lab/test results, encounter notes, upcoming appointments, etc.  Non-urgent messages can be sent to your provider as well.   To learn more about what you can do with MyChart, go to [email or web address].     

## 2020-09-14 ENCOUNTER — Other Ambulatory Visit (HOSPITAL_COMMUNITY): Payer: Self-pay | Admitting: Gastroenterology

## 2020-09-14 ENCOUNTER — Other Ambulatory Visit: Payer: Self-pay | Admitting: Gastroenterology

## 2020-09-14 DIAGNOSIS — R7989 Other specified abnormal findings of blood chemistry: Secondary | ICD-10-CM

## 2020-10-27 ENCOUNTER — Other Ambulatory Visit (HOSPITAL_COMMUNITY): Payer: Self-pay | Admitting: Physician Assistant

## 2020-10-27 MED ORDER — SODIUM CHLORIDE 0.9 % IV SOLN: INTRAVENOUS | Status: DC

## 2020-10-28 ENCOUNTER — Other Ambulatory Visit: Payer: Self-pay | Admitting: Radiology

## 2020-10-29 ENCOUNTER — Ambulatory Visit (HOSPITAL_COMMUNITY)
Admission: RE | Admit: 2020-10-29 | Discharge: 2020-10-29 | Disposition: A | Discharge status: Home / Self Care | Payer: BC Managed Care – PPO | Source: Ambulatory Visit | Attending: Gastroenterology | Admitting: Gastroenterology

## 2020-10-29 ENCOUNTER — Other Ambulatory Visit: Payer: Self-pay

## 2020-10-29 DIAGNOSIS — Z91030 Bee allergy status: Secondary | ICD-10-CM | POA: Diagnosis not present

## 2020-10-29 DIAGNOSIS — Z880 Allergy status to penicillin: Secondary | ICD-10-CM | POA: Diagnosis not present

## 2020-10-29 DIAGNOSIS — R748 Abnormal levels of other serum enzymes: Secondary | ICD-10-CM | POA: Diagnosis present

## 2020-10-29 DIAGNOSIS — R7989 Other specified abnormal findings of blood chemistry: Secondary | ICD-10-CM | POA: Diagnosis not present

## 2020-10-29 LAB — CBC
HCT: 45 % (ref 36.0–46.0)
Hemoglobin: 15.3 g/dL — ABNORMAL HIGH (ref 12.0–15.0)
MCH: 31.2 pg (ref 26.0–34.0)
MCHC: 34 g/dL (ref 30.0–36.0)
MCV: 91.6 fL (ref 80.0–100.0)
Platelets: 210 10*3/uL (ref 150–400)
RBC: 4.91 MIL/uL (ref 3.87–5.11)
RDW: 12.5 % (ref 11.5–15.5)
WBC: 4.4 10*3/uL (ref 4.0–10.5)
nRBC: 0 % (ref 0.0–0.2)

## 2020-10-29 LAB — PROTIME-INR
INR: 1 (ref 0.8–1.2)
Prothrombin Time: 13.2 seconds (ref 11.4–15.2)

## 2020-10-29 MED ORDER — FENTANYL CITRATE (PF) 100 MCG/2ML IJ SOLN
INTRAMUSCULAR | Status: AC | PRN
  Administered 2020-10-29 (×2): 25 ug via INTRAVENOUS
  Administered 2020-10-29: 50 ug via INTRAVENOUS

## 2020-10-29 MED ORDER — MIDAZOLAM HCL 2 MG/2ML IJ SOLN
INTRAMUSCULAR | Status: AC | PRN
  Administered 2020-10-29: 1 mg via INTRAVENOUS
  Administered 2020-10-29 (×2): .5 mg via INTRAVENOUS

## 2020-10-29 MED ORDER — SODIUM CHLORIDE 0.9 % IV SOLN
INTRAVENOUS | Status: DC
  Administered 2020-10-29: 10 mL/h via INTRAVENOUS

## 2020-10-29 MED ORDER — LIDOCAINE HCL (PF) 1 % IJ SOLN
INTRAMUSCULAR | Status: AC
  Filled 2020-10-29: qty 30

## 2020-10-29 MED ORDER — FENTANYL CITRATE (PF) 100 MCG/2ML IJ SOLN
INTRAMUSCULAR | Status: AC
  Filled 2020-10-29: qty 2

## 2020-10-29 MED ORDER — GELATIN ABSORBABLE 12-7 MM EX MISC
CUTANEOUS | Status: AC
  Filled 2020-10-29: qty 1

## 2020-10-29 MED ORDER — MIDAZOLAM HCL 2 MG/2ML IJ SOLN
INTRAMUSCULAR | Status: AC
  Filled 2020-10-29: qty 2

## 2020-10-29 NOTE — Procedures (Signed)
Interventional Radiology Procedure Note  Procedure: US guided random liver biopsy  Indication: Elevated LFT's  Findings: Please refer to procedural dictation for full description.  Complications: None  EBL: < 10 mL  Catheleen Langhorne, MD [phone]   

## 2020-10-29 NOTE — H&P (Signed)
Chief Complaint: Patient was seen in consultation today for non-focal liver biopsy  Referring Physician(s): McMichael,Bayley M  Supervising Physician: Mir, Mauri Reading  Patient Status: [facility] - Out-pt  History of Present Illness: Cindy Rivas is a 60 y.o. female with a medical history significant for elevated liver enzymes. Interventional Radiology has been asked to evaluate this patient for a non-focal liver biopsy for further work up.   Past Medical History:  Diagnosis Date   Chest discomfort    Post-menopausal    SUI (stress urinary incontinence, female)     No past surgical history on file.  Allergies: Bee venom, Bee pollen, and Penicillins  Medications: Prior to Admission medications   Not on File     Family History  Problem Relation Age of Onset   Heart disease Mother    Other Mother        BREAST TUMOR    Social History   Socioeconomic History   Marital status: Married    Spouse name: Not on file   Number of children: Not on file   Years of education: Not on file   Highest education level: Not on file  Occupational History   Not on file  Tobacco Use   Smoking status: Never   Smokeless tobacco: Never  Substance and Sexual Activity   Alcohol use: Not on file   Drug use: Not on file   Sexual activity: Not on file  Other Topics Concern   Not on file  Social History Narrative   Not on file   Social Determinants of Health   Financial Resource Strain: Not on file  Food Insecurity: Not on file  Transportation Needs: Not on file  Physical Activity: Not on file  Stress: Not on file  Social Connections: Not on file    Review of Systems: A 12 point ROS discussed and pertinent positives are indicated in the HPI above.  All other systems are negative.  Review of Systems  Constitutional:  Negative for appetite change and fatigue.  Respiratory:  Negative for cough and shortness of breath.   Cardiovascular:  Negative for chest pain and leg  swelling.  Gastrointestinal:  Negative for abdominal pain, diarrhea, nausea and vomiting.  Neurological:  Negative for dizziness and headaches.   Vital Signs: BP 121/72   Pulse 68   Temp 98.1 F (36.7 C) (Oral)   Resp 15   Ht 5\' 3"  (1.6 m)   Wt 180 lb (81.6 kg)   SpO2 99%   BMI 31.89 kg/m   Physical Exam Constitutional:      General: She is not in acute distress. HENT:     Mouth/Throat:     Mouth: Mucous membranes are moist.     Pharynx: Oropharynx is clear.  Cardiovascular:     Rate and Rhythm: Normal rate and regular rhythm.     Pulses: Normal pulses.     Heart sounds: Normal heart sounds.  Pulmonary:     Effort: Pulmonary effort is normal.     Breath sounds: Normal breath sounds.  Abdominal:     General: Bowel sounds are normal.     Palpations: Abdomen is soft.     Tenderness: There is no abdominal tenderness.  Musculoskeletal:        General: Normal range of motion.     Right lower leg: No edema.     Left lower leg: No edema.  Skin:    General: Skin is warm and dry.  Neurological:  Mental Status: She is alert and oriented to person, place, and time.    Imaging: No results found.  Labs:  CBC: Recent Labs    10/29/20 1206  WBC 4.4  HGB 15.3*  HCT 45.0  PLT 210    COAGS: Recent Labs    10/29/20 1206  INR 1.0    BMP: No results for input(s): NA, K, CL, CO2, GLUCOSE, BUN, CALCIUM, CREATININE, GFRNONAA, GFRAA in the last 8760 hours.  Invalid input(s): CMP  LIVER FUNCTION TESTS: No results for input(s): BILITOT, AST, ALT, ALKPHOS, PROT, ALBUMIN in the last 8760 hours.  TUMOR MARKERS: No results for input(s): AFPTM, CEA, CA199, CHROMGRNA in the last 8760 hours.  Assessment and Plan:  Elevated liver enzymes: Cindy Rivas. Cindy Rivas, 60 year old female, presents today to the [facility] Interventional Radiology department for an image-guided non-focal liver biopsy.  Risks and benefits of this procedure were discussed with the patient  and/or patient's family including, but not limited to bleeding, infection, damage to adjacent structures or low yield requiring additional tests.  All of the questions were answered and there is agreement to proceed.  Consent signed and in chart.  Thank you for this interesting consult.  I greatly enjoyed meeting Cindy Rivas and look forward to participating in their care.  A copy of this report was sent to the requesting provider on this date.  Electronically Signed: Alwyn Ren, AGACNP-BC [phone] 10/29/2020, 1:54 PM   I spent a total of  30 Minutes   in face to face in clinical consultation, greater than 50% of which was counseling/coordinating care for non-focal liver biopsy.

## 2020-10-29 NOTE — Sedation Documentation (Signed)
Called short stay to give report to receiving RN. Short stay unit unable to take report at this time, no RN available. Awaiting callback.

## 2020-11-12 LAB — SURGICAL PATHOLOGY

## 2020-12-22 ENCOUNTER — Other Ambulatory Visit: Payer: Self-pay | Admitting: Gastroenterology

## 2020-12-22 ENCOUNTER — Ambulatory Visit
Admission: RE | Admit: 2020-12-22 | Discharge: 2020-12-22 | Disposition: A | Discharge status: Home / Self Care | Payer: BC Managed Care – PPO | Source: Ambulatory Visit | Attending: Gastroenterology | Admitting: Gastroenterology

## 2020-12-22 DIAGNOSIS — D869 Sarcoidosis, unspecified: Secondary | ICD-10-CM

## 2021-12-06 ENCOUNTER — Ambulatory Visit: Payer: Self-pay

## 2021-12-06 ENCOUNTER — Other Ambulatory Visit: Payer: Self-pay | Admitting: Family Medicine

## 2021-12-06 DIAGNOSIS — M25511 Pain in right shoulder: Secondary | ICD-10-CM

## 2022-02-03 IMAGING — US US BIOPSY CORE LIVER
1 series · 11 of 11 positions shown · non-contrast
Comparison: none

INDICATION: Elevated liver function tests

[Series 1: us biopsy (liver) · 11 acquisitions, 11 frames shown]
[im 1/11]
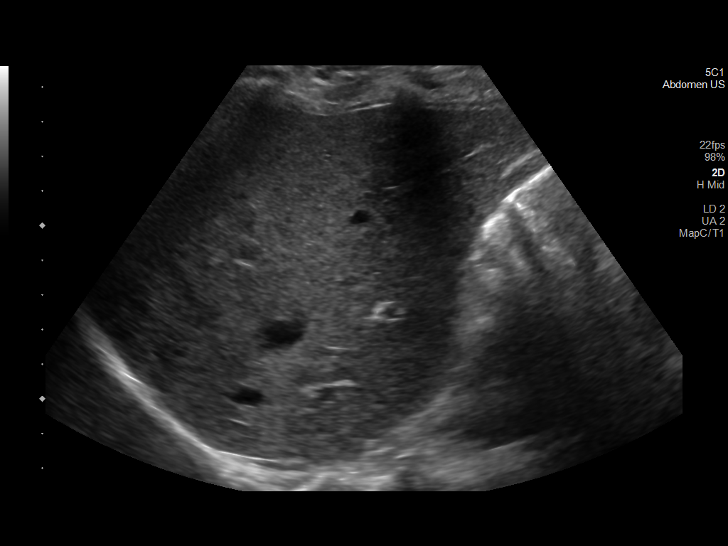
[im 2/11]
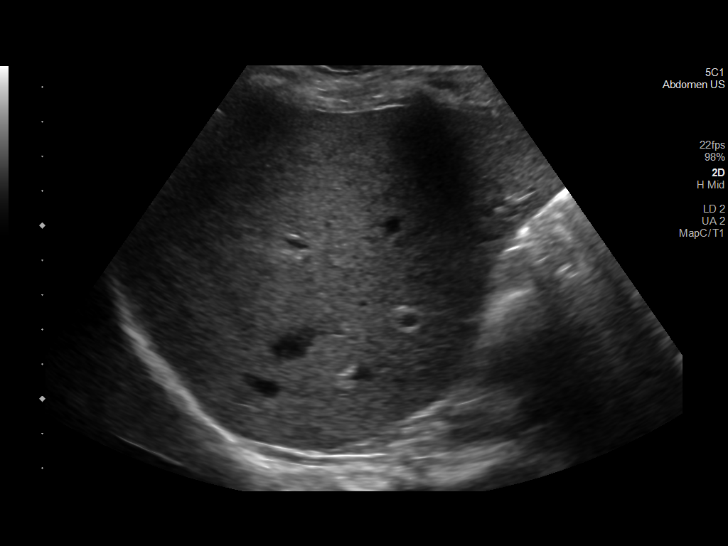
[im 3/11]
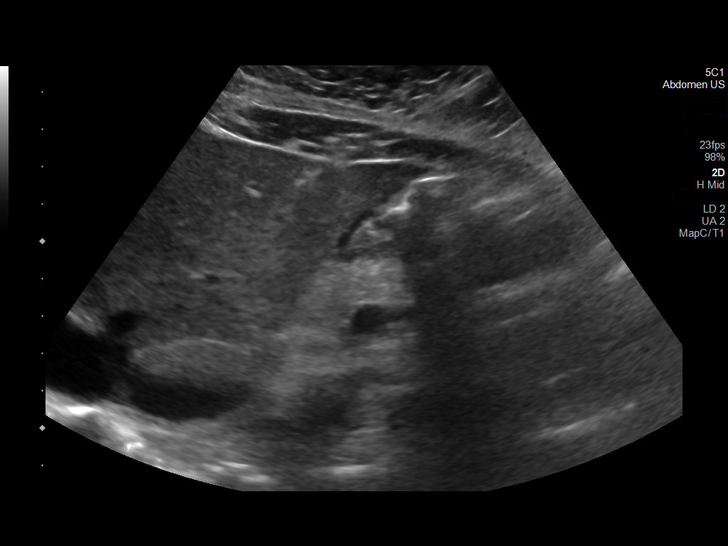
[im 4/11]
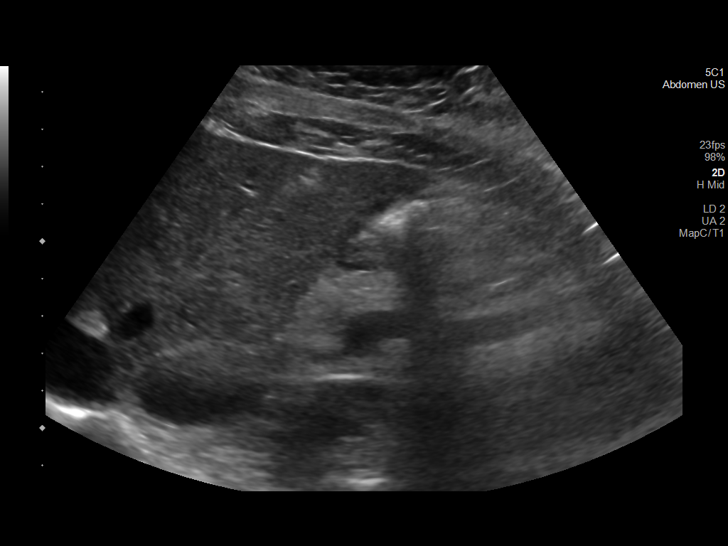
[im 5/11]
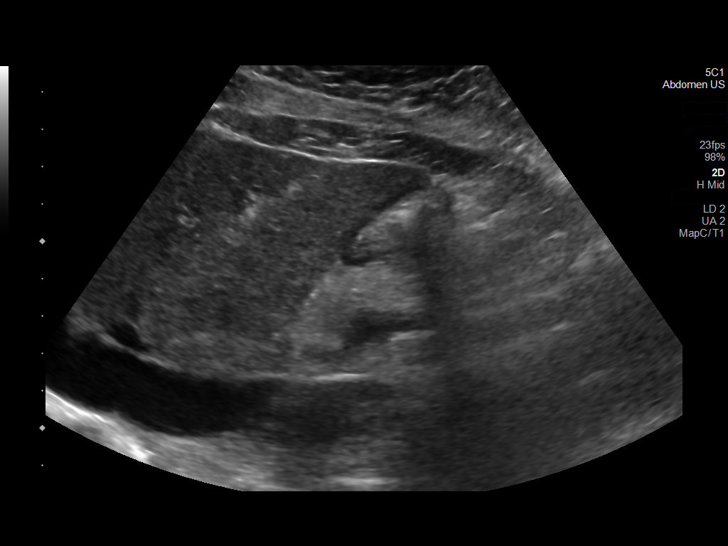
[im 6/11]
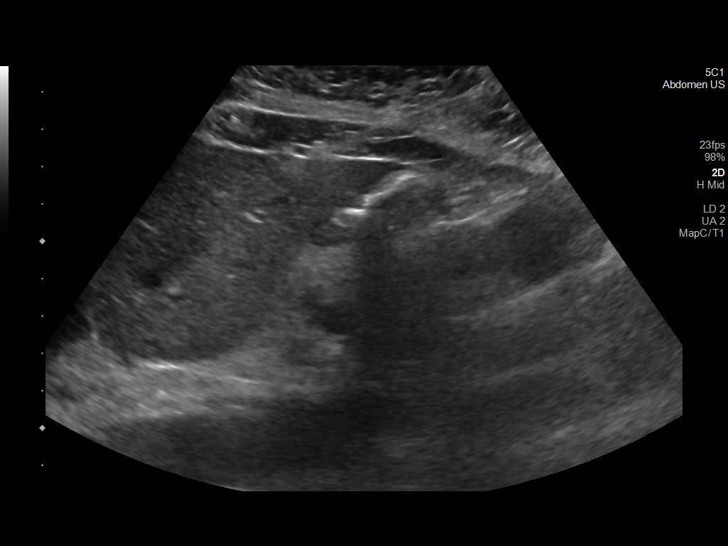
[im 7/11]
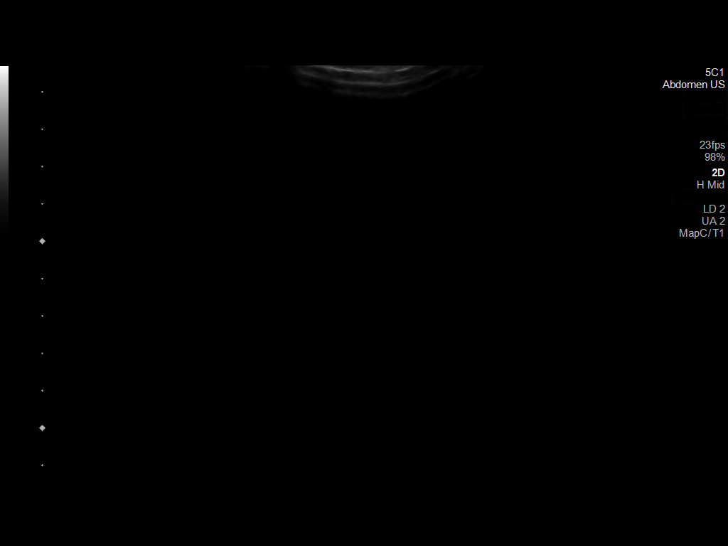
[im 8/11]
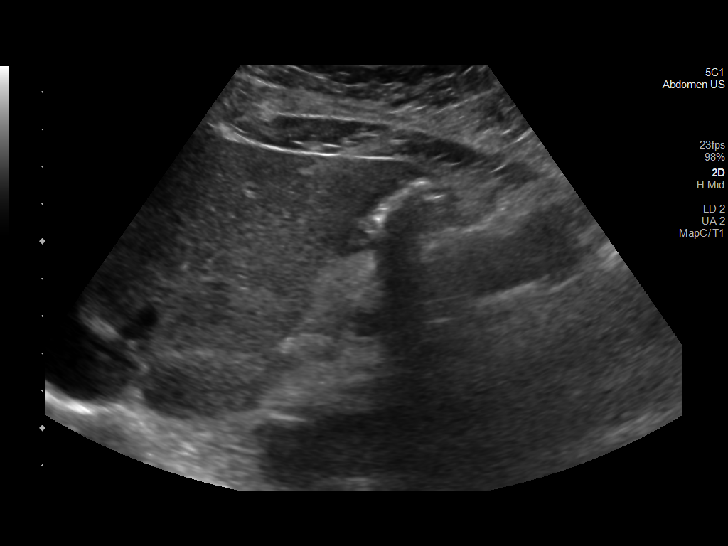
[im 9/11]
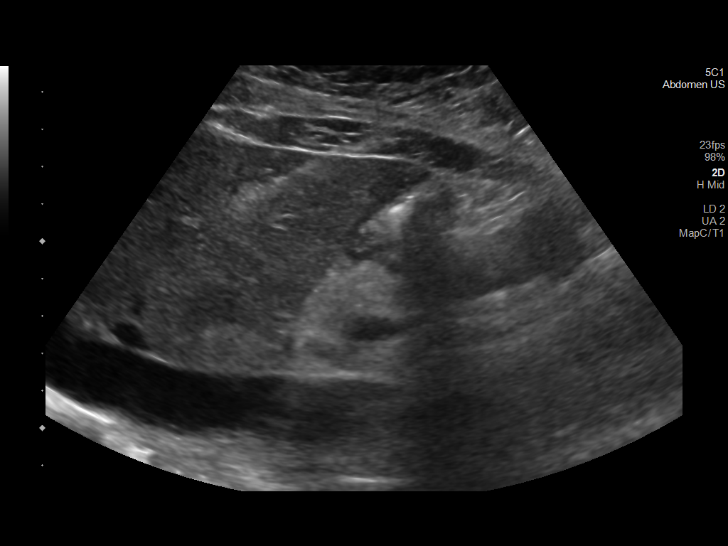
[im 10/11]
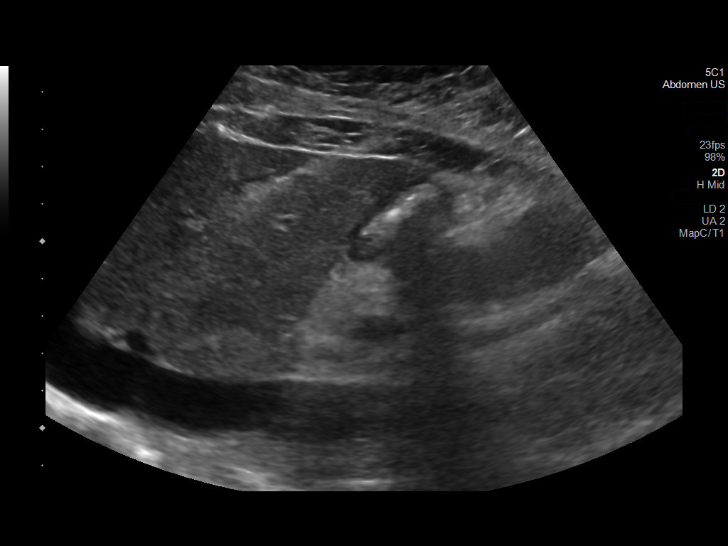
[im 11/11]
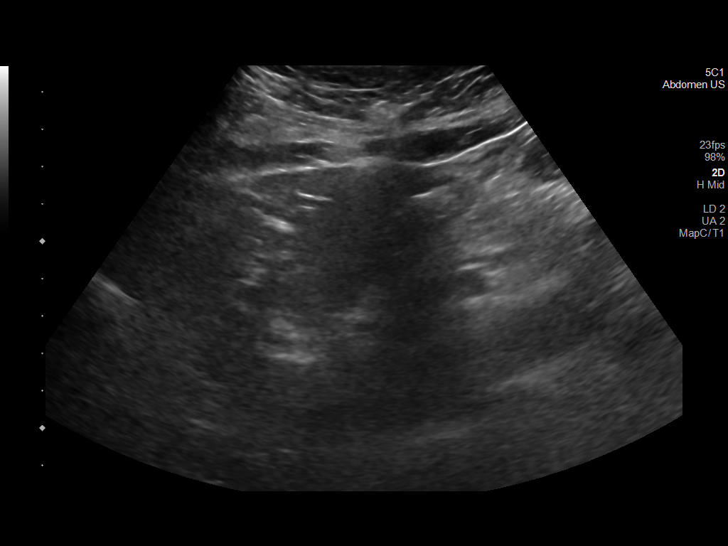

[11 of 11 positions shown; findings below may reference images not displayed]

EXAM:
Ultrasound-guided random liver biopsy

MEDICATIONS:
None.

ANESTHESIA/SEDATION:
Moderate (conscious) sedation was employed during this procedure. A
total of Versed 2 mg and Fentanyl 100 mcg was administered
intravenously.

Moderate Sedation Time: 12 minutes. The patient's level of
consciousness and vital signs were monitored continuously by
radiology nursing throughout the procedure under my direct
supervision.

COMPLICATIONS:
None immediate.

PROCEDURE:
Informed written consent was obtained from the patient after a
thorough discussion of the procedural risks, benefits and
alternatives. All questions were addressed. Maximal Sterile Barrier
Technique was utilized including caps, mask, sterile gowns, sterile
gloves, sterile drape, hand hygiene and skin antiseptic. A timeout
was performed prior to the initiation of the procedure.

Patient position supine on the ultrasound table.

Epigastric skin prepped and draped in usual sterile fashion.

Following local lidocaine administration, 17 gauge introducer needle
was advanced into the left hepatic lobe, and three 18 gauge cores
were obtained utilizing continuous ultrasound guidance.

Gelfoam slurry was administered through the introducer needle at the
biopsy site.

Samples were sent to pathology in formalin.

Needle removed and hemostasis achieved with 5 minutes of manual
compression.

Post procedure ultrasound images showed no evidence of significant
hemorrhage.
IMPRESSION: Ultrasound-guided random liver biopsy as above.

## 2022-03-29 IMAGING — DX DG CHEST 2V
2 series · 2 of 2 positions shown · non-contrast
Comparison: None.

CLINICAL DATA: Sarcoidosis

EXAM:
CHEST - 2 VIEW

[dg chest 2 view (1 of 2)]
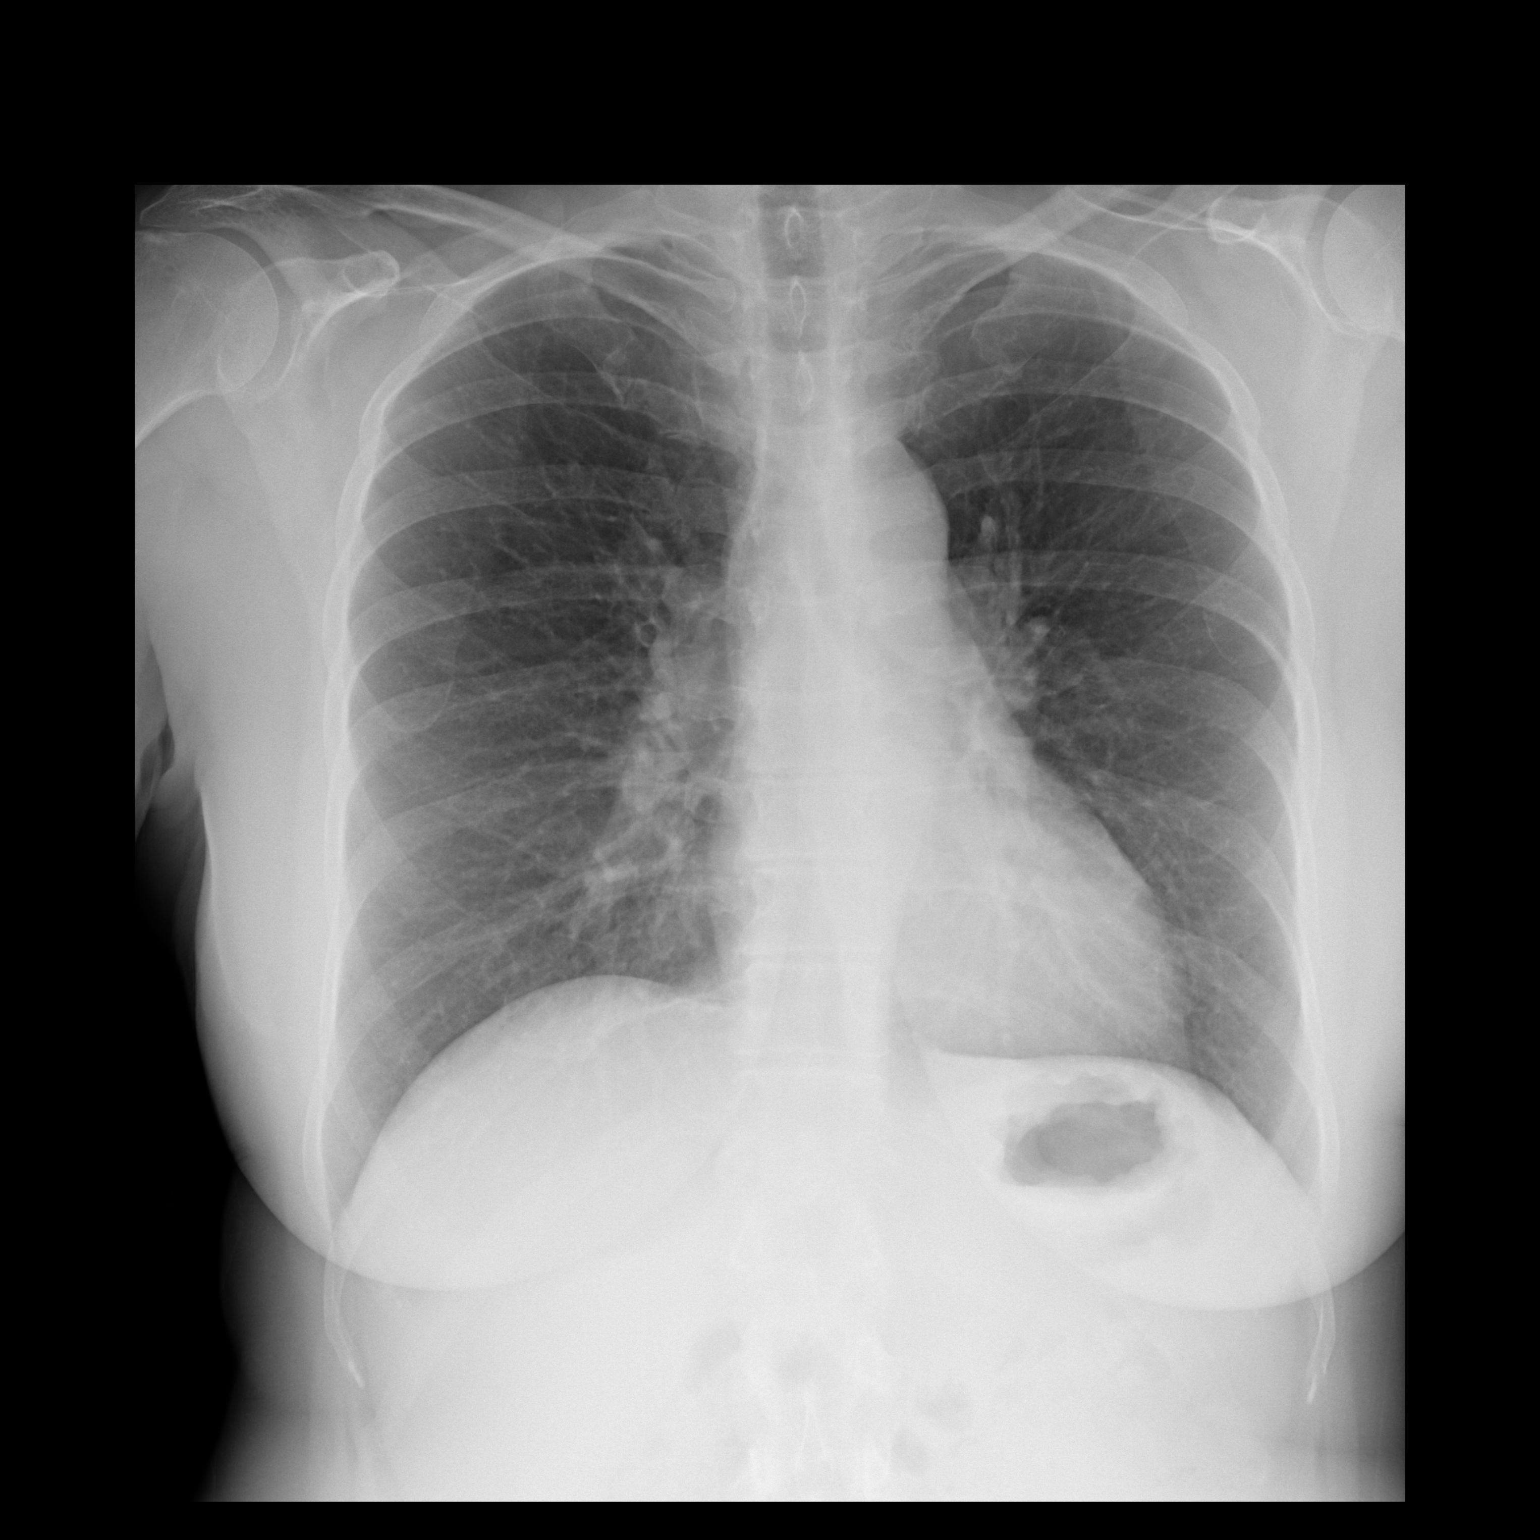

[dg chest 2 view (2 of 2)]
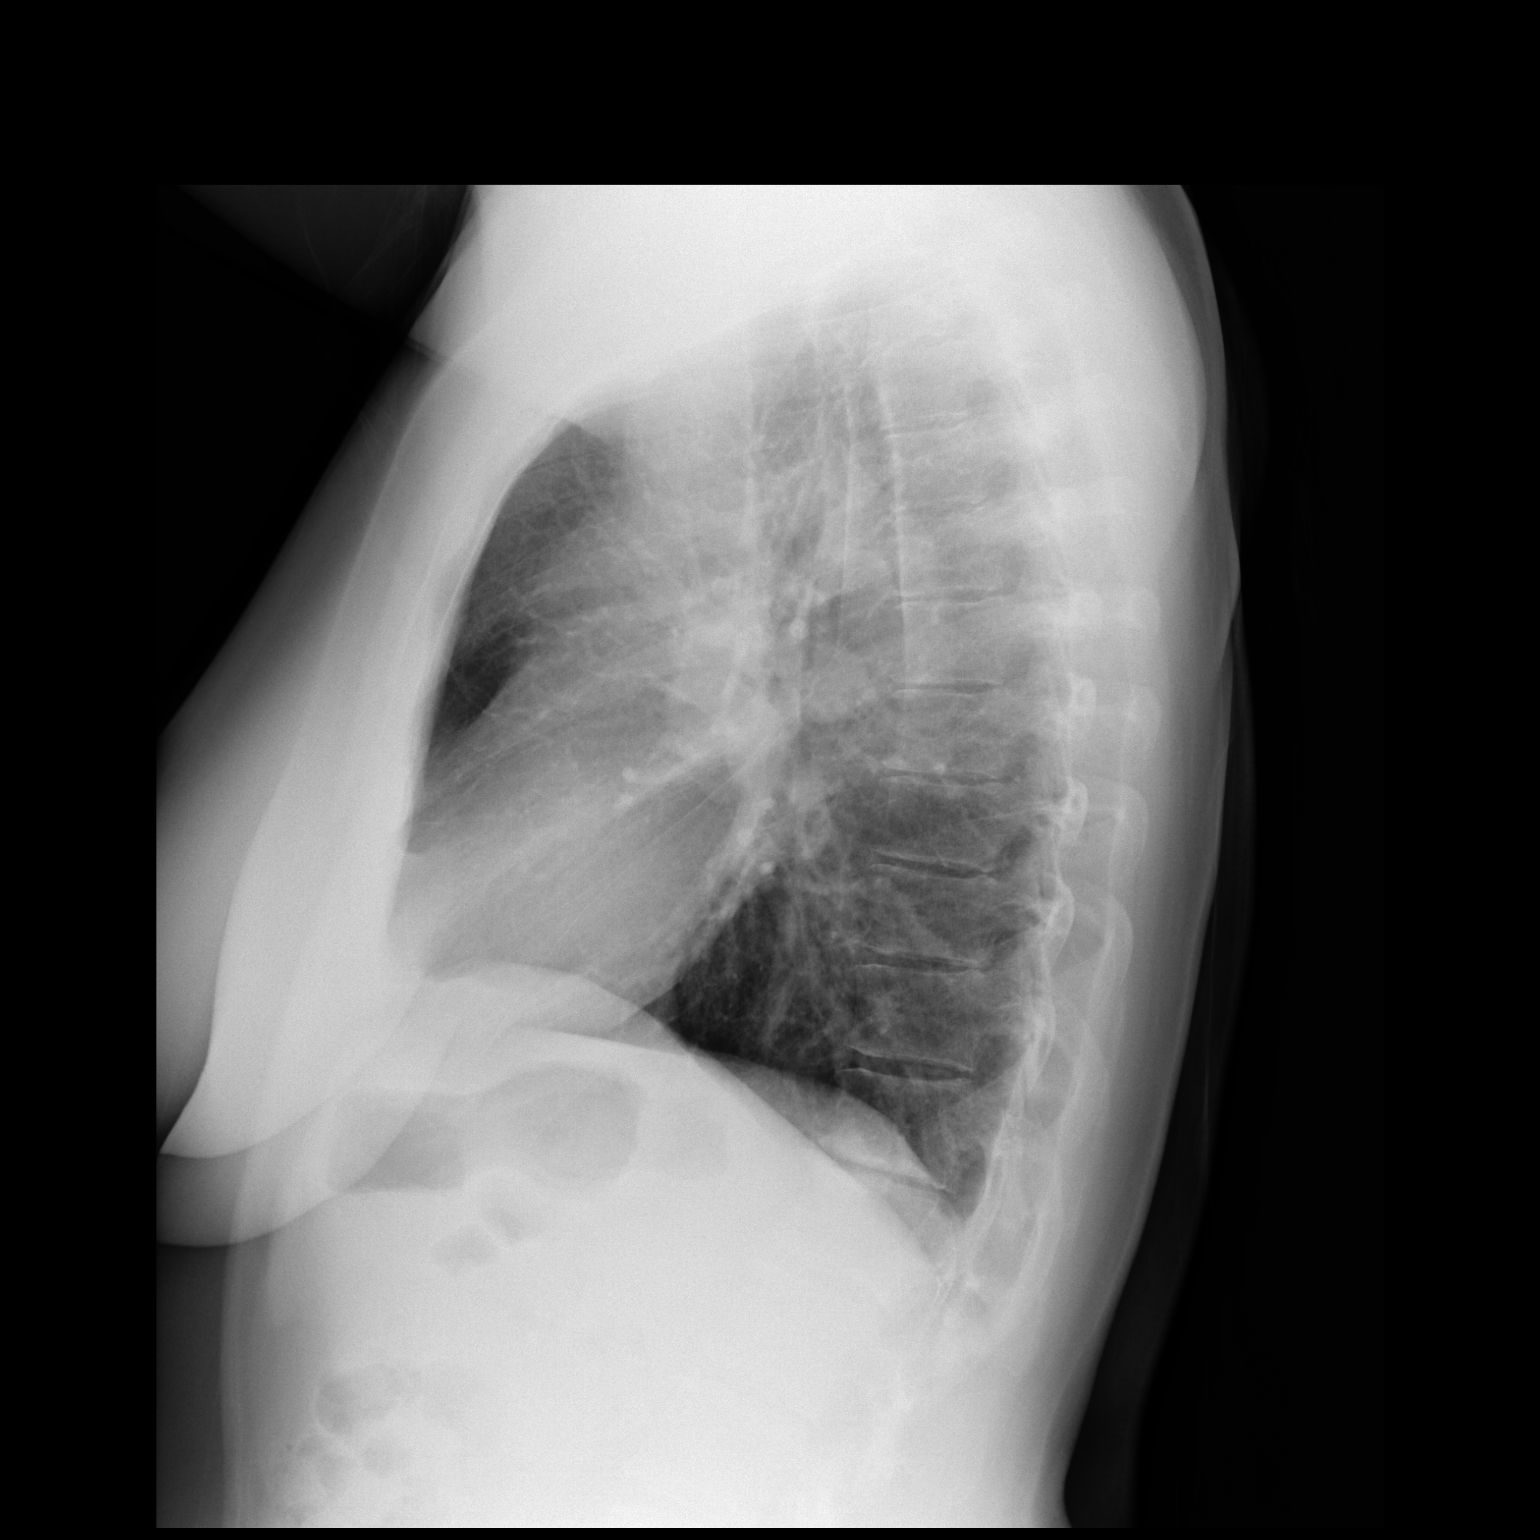

[2 of 2 positions shown; findings below may reference images not displayed]

FINDINGS: The heart size is normal. Upper mediastinal contours are normal.
Prominent hilar soft tissue may reflect hilar lymphadenopathy.

There is no focal consolidation or pulmonary edema. There is no
pleural effusion or pneumothorax.

There is no acute osseous abnormality.
IMPRESSION: Fullness of the hila bilaterally may reflect hilar lymphadenopathy
in the setting of sarcoid. CT may be considered for further
evaluation as indicated.
